# Patient Record
Sex: Female | Born: 1975 | Race: White | Hispanic: No | Marital: Married | Smoking: Former smoker
Health system: Southern US, Community
[De-identification: ages and names within clinical notes are randomized; demographics above are authoritative.]

## PROBLEM LIST (undated history)

## (undated) DIAGNOSIS — D759 Disease of blood and blood-forming organs, unspecified: Secondary | ICD-10-CM

## (undated) DIAGNOSIS — Z9049 Acquired absence of other specified parts of digestive tract: Secondary | ICD-10-CM

## (undated) DIAGNOSIS — Z98891 History of uterine scar from previous surgery: Secondary | ICD-10-CM

## (undated) DIAGNOSIS — I10 Essential (primary) hypertension: Secondary | ICD-10-CM

## (undated) DIAGNOSIS — D649 Anemia, unspecified: Secondary | ICD-10-CM

## (undated) DIAGNOSIS — Z9882 Breast implant status: Secondary | ICD-10-CM

## (undated) DIAGNOSIS — Z87891 Personal history of nicotine dependence: Secondary | ICD-10-CM

## (undated) HISTORY — PX: BREAST SURGERY: SHX581

## (undated) HISTORY — PX: WISDOM TOOTH EXTRACTION: SHX21

## (undated) HISTORY — PX: APPENDECTOMY: SHX54

---

## 1997-06-17 ENCOUNTER — Other Ambulatory Visit: Admission: RE | Admit: 1997-06-17 | Discharge: 1997-06-17 | Payer: Self-pay | Admitting: *Deleted

## 1997-06-26 ENCOUNTER — Other Ambulatory Visit: Admission: RE | Admit: 1997-06-26 | Discharge: 1997-06-26 | Payer: Self-pay | Admitting: Sports Medicine

## 1998-07-22 ENCOUNTER — Other Ambulatory Visit: Admission: RE | Admit: 1998-07-22 | Discharge: 1998-07-22 | Payer: Self-pay | Admitting: *Deleted

## 1999-01-29 ENCOUNTER — Other Ambulatory Visit: Admission: RE | Admit: 1999-01-29 | Discharge: 1999-01-29 | Payer: Self-pay | Admitting: *Deleted

## 1999-11-30 ENCOUNTER — Other Ambulatory Visit: Admission: RE | Admit: 1999-11-30 | Discharge: 1999-11-30 | Payer: Self-pay | Admitting: *Deleted

## 2000-05-30 ENCOUNTER — Other Ambulatory Visit: Admission: RE | Admit: 2000-05-30 | Discharge: 2000-05-30 | Payer: Self-pay | Admitting: *Deleted

## 2000-06-15 ENCOUNTER — Encounter (INDEPENDENT_AMBULATORY_CARE_PROVIDER_SITE_OTHER): Payer: Self-pay | Admitting: Specialist

## 2000-06-15 ENCOUNTER — Other Ambulatory Visit: Admission: RE | Admit: 2000-06-15 | Discharge: 2000-06-15 | Payer: Self-pay | Admitting: *Deleted

## 2000-08-28 ENCOUNTER — Encounter: Admission: RE | Admit: 2000-08-28 | Discharge: 2000-08-28 | Payer: Self-pay | Admitting: *Deleted

## 2001-04-19 ENCOUNTER — Other Ambulatory Visit: Admission: RE | Admit: 2001-04-19 | Discharge: 2001-04-19 | Payer: Self-pay | Admitting: *Deleted

## 2001-08-29 ENCOUNTER — Other Ambulatory Visit: Admission: RE | Admit: 2001-08-29 | Discharge: 2001-08-29 | Payer: Self-pay | Admitting: *Deleted

## 2002-02-22 ENCOUNTER — Other Ambulatory Visit: Admission: RE | Admit: 2002-02-22 | Discharge: 2002-02-22 | Payer: Self-pay | Admitting: *Deleted

## 2002-08-29 ENCOUNTER — Other Ambulatory Visit: Admission: RE | Admit: 2002-08-29 | Discharge: 2002-08-29 | Payer: Self-pay | Admitting: *Deleted

## 2003-03-22 DIAGNOSIS — I4510 Unspecified right bundle-branch block: Secondary | ICD-10-CM

## 2003-03-22 HISTORY — DX: Unspecified right bundle-branch block: I45.10

## 2007-03-04 ENCOUNTER — Encounter
Admission: RE | Admit: 2007-03-04 | Discharge: 2007-03-04 | Payer: Self-pay | Admitting: Physical Medicine and Rehabilitation

## 2012-09-13 LAB — OB RESULTS CONSOLE RUBELLA ANTIBODY, IGM: Rubella: IMMUNE

## 2012-09-13 LAB — OB RESULTS CONSOLE HIV ANTIBODY (ROUTINE TESTING): HIV: NONREACTIVE

## 2012-09-13 LAB — OB RESULTS CONSOLE RPR: RPR: NONREACTIVE

## 2012-09-13 LAB — OB RESULTS CONSOLE ABO/RH: RH Type: POSITIVE

## 2012-09-13 LAB — OB RESULTS CONSOLE ANTIBODY SCREEN: Antibody Screen: NEGATIVE

## 2012-09-13 LAB — OB RESULTS CONSOLE HEPATITIS B SURFACE ANTIGEN: Hepatitis B Surface Ag: NEGATIVE

## 2013-03-28 ENCOUNTER — Other Ambulatory Visit: Payer: Self-pay | Admitting: Obstetrics

## 2013-04-01 ENCOUNTER — Encounter (HOSPITAL_COMMUNITY): Payer: Self-pay | Admitting: Pharmacist

## 2013-04-11 ENCOUNTER — Encounter (HOSPITAL_COMMUNITY): Payer: Self-pay

## 2013-04-12 ENCOUNTER — Encounter (HOSPITAL_COMMUNITY): Payer: Self-pay

## 2013-04-12 ENCOUNTER — Encounter (HOSPITAL_COMMUNITY)
Admission: RE | Admit: 2013-04-12 | Discharge: 2013-04-12 | Disposition: A | Discharge status: Home / Self Care | Payer: BC Managed Care – PPO | Source: Ambulatory Visit | Attending: Obstetrics | Admitting: Obstetrics

## 2013-04-12 DIAGNOSIS — Z01818 Encounter for other preprocedural examination: Secondary | ICD-10-CM

## 2013-04-12 DIAGNOSIS — Z01812 Encounter for preprocedural laboratory examination: Secondary | ICD-10-CM

## 2013-04-12 HISTORY — DX: Disease of blood and blood-forming organs, unspecified: D75.9

## 2013-04-12 LAB — CBC
HCT: 35.1 % — ABNORMAL LOW (ref 36.0–46.0)
Hemoglobin: 12.2 g/dL (ref 12.0–15.0)
MCH: 29.8 pg (ref 26.0–34.0)
MCHC: 34.8 g/dL (ref 30.0–36.0)
MCV: 85.8 fL (ref 78.0–100.0)
Platelets: 293 10*3/uL (ref 150–400)
RBC: 4.09 MIL/uL (ref 3.87–5.11)
RDW: 13.4 % (ref 11.5–15.5)
WBC: 10.4 10*3/uL (ref 4.0–10.5)

## 2013-04-12 LAB — TYPE AND SCREEN
ABO/RH(D): A POS
Antibody Screen: NEGATIVE

## 2013-04-12 LAB — RPR: RPR Ser Ql: NONREACTIVE

## 2013-04-12 LAB — ABO/RH: ABO/RH(D): A POS

## 2013-04-12 NOTE — Patient Instructions (Signed)
[address] Dutchnne A Pintor  04/12/2013   Your procedure is scheduled on:  04/15/13  Enter through the Main Entrance of [facility] at 1130 AM.  Pick up the phone at the desk and dial [phone].   Call this number if you have problems the morning of surgery: [phone]   Remember:   Do not eat food:After Midnight.  Do not drink clear liquids: 4 Hours before arrival.  Take these medicines the morning of surgery with A SIP OF WATER: NA   Do not wear jewelry, make-up or nail polish.  Do not wear lotions, powders, or perfumes. You may wear deodorant.  Do not shave 48 hours prior to surgery.  Do not bring valuables to the hospital.  [facility] is not   responsible for any belongings or valuables brought to the hospital.  Contacts, dentures or bridgework may not be worn into surgery.  Leave suitcase in the car. After surgery it may be brought to your room.  For patients admitted to the hospital, checkout time is 11:00 AM the day of              discharge.   Patients discharged the day of surgery will not be allowed to drive             home.  Name and phone number of your driver: NA  Special Instructions:   Shower using CHG 2 nights before surgery and the night before surgery.  If you shower the day of surgery use CHG.  Use special wash - you have one bottle of CHG for all showers.  You should use approximately 1/3 of the bottle for each shower.   Please read over the following fact sheets that you were given:   Surgical Site Infection Prevention

## 2013-04-15 ENCOUNTER — Inpatient Hospital Stay (HOSPITAL_COMMUNITY): Payer: BC Managed Care – PPO | Admitting: Anesthesiology

## 2013-04-15 ENCOUNTER — Inpatient Hospital Stay (HOSPITAL_COMMUNITY)
Admission: AD | Admit: 2013-04-15 | Discharge: 2013-04-18 | DRG: 765 | Disposition: A | Discharge status: Home / Self Care | Payer: BC Managed Care – PPO | Source: Ambulatory Visit | Attending: Obstetrics | Admitting: Obstetrics

## 2013-04-15 ENCOUNTER — Encounter (HOSPITAL_COMMUNITY): Payer: Self-pay | Admitting: *Deleted

## 2013-04-15 ENCOUNTER — Inpatient Hospital Stay (HOSPITAL_COMMUNITY): Admission: AD | Admit: 2013-04-15 | Payer: BC Managed Care – PPO | Source: Ambulatory Visit | Admitting: Obstetrics

## 2013-04-15 ENCOUNTER — Encounter (HOSPITAL_COMMUNITY)
Admission: AD | Disposition: A | Discharge status: Home / Self Care | Payer: Self-pay | Source: Ambulatory Visit | Attending: Obstetrics

## 2013-04-15 ENCOUNTER — Encounter (HOSPITAL_COMMUNITY): Payer: BC Managed Care – PPO | Admitting: Anesthesiology

## 2013-04-15 DIAGNOSIS — Z22330 Carrier of Group B streptococcus: Secondary | ICD-10-CM

## 2013-04-15 DIAGNOSIS — Z87891 Personal history of nicotine dependence: Secondary | ICD-10-CM

## 2013-04-15 DIAGNOSIS — O99892 Other specified diseases and conditions complicating childbirth: Secondary | ICD-10-CM | POA: Diagnosis present

## 2013-04-15 DIAGNOSIS — O9989 Other specified diseases and conditions complicating pregnancy, childbirth and the puerperium: Secondary | ICD-10-CM

## 2013-04-15 DIAGNOSIS — A6000 Herpesviral infection of urogenital system, unspecified: Secondary | ICD-10-CM | POA: Diagnosis present

## 2013-04-15 DIAGNOSIS — O409XX0 Polyhydramnios, unspecified trimester, not applicable or unspecified: Secondary | ICD-10-CM | POA: Diagnosis present

## 2013-04-15 DIAGNOSIS — D689 Coagulation defect, unspecified: Secondary | ICD-10-CM | POA: Diagnosis present

## 2013-04-15 DIAGNOSIS — O09529 Supervision of elderly multigravida, unspecified trimester: Secondary | ICD-10-CM | POA: Diagnosis present

## 2013-04-15 DIAGNOSIS — Q51810 Arcuate uterus: Secondary | ICD-10-CM

## 2013-04-15 DIAGNOSIS — D6859 Other primary thrombophilia: Secondary | ICD-10-CM | POA: Diagnosis present

## 2013-04-15 DIAGNOSIS — O321XX0 Maternal care for breech presentation, not applicable or unspecified: Principal | ICD-10-CM | POA: Diagnosis present

## 2013-04-15 DIAGNOSIS — O3660X0 Maternal care for excessive fetal growth, unspecified trimester, not applicable or unspecified: Secondary | ICD-10-CM | POA: Diagnosis present

## 2013-04-15 DIAGNOSIS — O9912 Other diseases of the blood and blood-forming organs and certain disorders involving the immune mechanism complicating childbirth: Secondary | ICD-10-CM | POA: Diagnosis present

## 2013-04-15 DIAGNOSIS — O98519 Other viral diseases complicating pregnancy, unspecified trimester: Secondary | ICD-10-CM | POA: Diagnosis present

## 2013-04-15 LAB — CBC
HCT: 36.1 % (ref 36.0–46.0)
Hemoglobin: 12.8 g/dL (ref 12.0–15.0)
MCH: 30.3 pg (ref 26.0–34.0)
MCHC: 35.5 g/dL (ref 30.0–36.0)
MCV: 85.5 fL (ref 78.0–100.0)
Platelets: 256 10*3/uL (ref 150–400)
RBC: 4.22 MIL/uL (ref 3.87–5.11)
RDW: 13.4 % (ref 11.5–15.5)
WBC: 13.1 10*3/uL — ABNORMAL HIGH (ref 4.0–10.5)

## 2013-04-15 LAB — RPR: RPR Ser Ql: NONREACTIVE

## 2013-04-15 SURGERY — Surgical Case
Anesthesia: Spinal

## 2013-04-15 MED ORDER — HYDROMORPHONE HCL PF 1 MG/ML IJ SOLN: 0.2500 mg | INTRAMUSCULAR | Status: DC | PRN

## 2013-04-15 MED ORDER — LACTATED RINGERS IV SOLN
INTRAVENOUS | Status: DC
  Administered 2013-04-15 (×2): via INTRAVENOUS

## 2013-04-15 MED ORDER — DIPHENHYDRAMINE HCL 25 MG PO CAPS: 25.0000 mg | ORAL_CAPSULE | Freq: Four times a day (QID) | ORAL | Status: DC | PRN

## 2013-04-15 MED ORDER — OXYTOCIN 10 UNIT/ML IJ SOLN
40.0000 [IU] | INTRAVENOUS | Status: DC | PRN
  Administered 2013-04-15: 40 [IU] via INTRAVENOUS

## 2013-04-15 MED ORDER — METOCLOPRAMIDE HCL 5 MG/ML IJ SOLN: 10.0000 mg | Freq: Three times a day (TID) | INTRAMUSCULAR | Status: DC | PRN

## 2013-04-15 MED ORDER — LANOLIN HYDROUS EX OINT: 1.0000 "application " | TOPICAL_OINTMENT | CUTANEOUS | Status: DC | PRN

## 2013-04-15 MED ORDER — IBUPROFEN 600 MG PO TABS: 600.0000 mg | ORAL_TABLET | Freq: Four times a day (QID) | ORAL | Status: DC | PRN

## 2013-04-15 MED ORDER — NALBUPHINE HCL 10 MG/ML IJ SOLN
5.0000 mg | INTRAMUSCULAR | Status: DC | PRN
  Filled 2013-04-15: qty 1

## 2013-04-15 MED ORDER — ONDANSETRON HCL 4 MG/2ML IJ SOLN
INTRAMUSCULAR | Status: DC | PRN
  Administered 2013-04-15: 4 mg via INTRAVENOUS

## 2013-04-15 MED ORDER — DIPHENHYDRAMINE HCL 50 MG/ML IJ SOLN: 12.5000 mg | INTRAMUSCULAR | Status: DC | PRN

## 2013-04-15 MED ORDER — NALOXONE HCL 0.4 MG/ML IJ SOLN: 0.4000 mg | INTRAMUSCULAR | Status: DC | PRN

## 2013-04-15 MED ORDER — FENTANYL CITRATE 0.05 MG/ML IJ SOLN
INTRAMUSCULAR | Status: AC
  Filled 2013-04-15: qty 2

## 2013-04-15 MED ORDER — WITCH HAZEL-GLYCERIN EX PADS: 1.0000 "application " | MEDICATED_PAD | CUTANEOUS | Status: DC | PRN

## 2013-04-15 MED ORDER — ONDANSETRON HCL 4 MG PO TABS: 4.0000 mg | ORAL_TABLET | ORAL | Status: DC | PRN

## 2013-04-15 MED ORDER — SODIUM CHLORIDE 0.9 % IJ SOLN: 3.0000 mL | INTRAMUSCULAR | Status: DC | PRN

## 2013-04-15 MED ORDER — DIPHENHYDRAMINE HCL 50 MG/ML IJ SOLN: 25.0000 mg | INTRAMUSCULAR | Status: DC | PRN

## 2013-04-15 MED ORDER — ONDANSETRON HCL 4 MG/2ML IJ SOLN: 4.0000 mg | Freq: Three times a day (TID) | INTRAMUSCULAR | Status: DC | PRN

## 2013-04-15 MED ORDER — FAMOTIDINE IN NACL 20-0.9 MG/50ML-% IV SOLN
20.0000 mg | Freq: Once | INTRAVENOUS | Status: AC
  Administered 2013-04-15: 20 mg via INTRAVENOUS
  Filled 2013-04-15: qty 50

## 2013-04-15 MED ORDER — KETOROLAC TROMETHAMINE 30 MG/ML IJ SOLN
INTRAMUSCULAR | Status: AC
  Administered 2013-04-15: 30 mg via INTRAVENOUS
  Filled 2013-04-15: qty 1

## 2013-04-15 MED ORDER — KETOROLAC TROMETHAMINE 30 MG/ML IJ SOLN
30.0000 mg | Freq: Four times a day (QID) | INTRAMUSCULAR | Status: DC | PRN
  Administered 2013-04-15: 30 mg via INTRAVENOUS

## 2013-04-15 MED ORDER — TETANUS-DIPHTH-ACELL PERTUSSIS 5-2.5-18.5 LF-MCG/0.5 IM SUSP: 0.5000 mL | Freq: Once | INTRAMUSCULAR | Status: DC

## 2013-04-15 MED ORDER — ZOLPIDEM TARTRATE 5 MG PO TABS: 5.0000 mg | ORAL_TABLET | Freq: Every evening | ORAL | Status: DC | PRN

## 2013-04-15 MED ORDER — FENTANYL CITRATE 0.05 MG/ML IJ SOLN
INTRAMUSCULAR | Status: DC | PRN
  Administered 2013-04-15: 10 ug via INTRATHECAL

## 2013-04-15 MED ORDER — PROMETHAZINE HCL 25 MG/ML IJ SOLN: 6.2500 mg | INTRAMUSCULAR | Status: DC | PRN

## 2013-04-15 MED ORDER — IBUPROFEN 600 MG PO TABS
600.0000 mg | ORAL_TABLET | Freq: Four times a day (QID) | ORAL | Status: DC
  Administered 2013-04-15 – 2013-04-18 (×13): 600 mg via ORAL
  Filled 2013-04-15 (×13): qty 1

## 2013-04-15 MED ORDER — LACTATED RINGERS IV SOLN
INTRAVENOUS | Status: DC
  Administered 2013-04-15: 16:00:00 via INTRAVENOUS

## 2013-04-15 MED ORDER — MEPERIDINE HCL 25 MG/ML IJ SOLN
INTRAMUSCULAR | Status: DC | PRN
  Administered 2013-04-15: 12.5 mg via INTRAVENOUS

## 2013-04-15 MED ORDER — MEPERIDINE HCL 25 MG/ML IJ SOLN
INTRAMUSCULAR | Status: AC
  Filled 2013-04-15: qty 1

## 2013-04-15 MED ORDER — KETOROLAC TROMETHAMINE 30 MG/ML IJ SOLN: 15.0000 mg | Freq: Once | INTRAMUSCULAR | Status: DC | PRN

## 2013-04-15 MED ORDER — PHENYLEPHRINE HCL 10 MG/ML IJ SOLN
INTRAMUSCULAR | Status: DC | PRN
  Administered 2013-04-15: 40 ug via INTRAVENOUS
  Administered 2013-04-15 (×6): 80 ug via INTRAVENOUS

## 2013-04-15 MED ORDER — SIMETHICONE 80 MG PO CHEW
80.0000 mg | CHEWABLE_TABLET | Freq: Three times a day (TID) | ORAL | Status: DC
  Administered 2013-04-15 – 2013-04-18 (×8): 80 mg via ORAL
  Filled 2013-04-15 (×7): qty 1

## 2013-04-15 MED ORDER — SIMETHICONE 80 MG PO CHEW: 80.0000 mg | CHEWABLE_TABLET | ORAL | Status: DC | PRN

## 2013-04-15 MED ORDER — CEFAZOLIN SODIUM-DEXTROSE 2-3 GM-% IV SOLR
2.0000 g | INTRAVENOUS | Status: AC
  Administered 2013-04-15: 2 g via INTRAVENOUS

## 2013-04-15 MED ORDER — MORPHINE SULFATE 0.5 MG/ML IJ SOLN
INTRAMUSCULAR | Status: AC
  Filled 2013-04-15: qty 10

## 2013-04-15 MED ORDER — SIMETHICONE 80 MG PO CHEW
80.0000 mg | CHEWABLE_TABLET | ORAL | Status: DC
  Administered 2013-04-15 – 2013-04-18 (×3): 80 mg via ORAL
  Filled 2013-04-15 (×5): qty 1

## 2013-04-15 MED ORDER — DIBUCAINE 1 % RE OINT: 1.0000 "application " | TOPICAL_OINTMENT | RECTAL | Status: DC | PRN

## 2013-04-15 MED ORDER — OXYCODONE-ACETAMINOPHEN 5-325 MG PO TABS
1.0000 | ORAL_TABLET | ORAL | Status: DC | PRN
  Administered 2013-04-15: 2 via ORAL
  Administered 2013-04-16 – 2013-04-18 (×5): 1 via ORAL
  Filled 2013-04-15 (×3): qty 1
  Filled 2013-04-15: qty 2
  Filled 2013-04-15 (×2): qty 1

## 2013-04-15 MED ORDER — MORPHINE SULFATE (PF) 0.5 MG/ML IJ SOLN
INTRAMUSCULAR | Status: DC | PRN
  Administered 2013-04-15: .2 mg via INTRATHECAL

## 2013-04-15 MED ORDER — MEPERIDINE HCL 25 MG/ML IJ SOLN: 6.2500 mg | INTRAMUSCULAR | Status: DC | PRN

## 2013-04-15 MED ORDER — MENTHOL 3 MG MT LOZG: 1.0000 | LOZENGE | OROMUCOSAL | Status: DC | PRN

## 2013-04-15 MED ORDER — SCOPOLAMINE 1 MG/3DAYS TD PT72
1.0000 | MEDICATED_PATCH | Freq: Once | TRANSDERMAL | Status: AC
  Administered 2013-04-15: 1 via TRANSDERMAL
  Filled 2013-04-15: qty 1

## 2013-04-15 MED ORDER — LACTATED RINGERS IV SOLN
INTRAVENOUS | Status: DC
  Administered 2013-04-15: 03:00:00 via INTRAVENOUS

## 2013-04-15 MED ORDER — KETOROLAC TROMETHAMINE 30 MG/ML IJ SOLN: 30.0000 mg | Freq: Four times a day (QID) | INTRAMUSCULAR | Status: DC | PRN

## 2013-04-15 MED ORDER — OXYTOCIN 40 UNITS IN LACTATED RINGERS INFUSION - SIMPLE MED: 62.5000 mL/h | INTRAVENOUS | Status: AC

## 2013-04-15 MED ORDER — LACTATED RINGERS IV BOLUS (SEPSIS): 1000.0000 mL | Freq: Once | INTRAVENOUS | Status: DC

## 2013-04-15 MED ORDER — BUPIVACAINE IN DEXTROSE 0.75-8.25 % IT SOLN
INTRATHECAL | Status: DC | PRN
  Administered 2013-04-15: 1.4 mL via INTRATHECAL

## 2013-04-15 MED ORDER — LACTATED RINGERS IV SOLN: Freq: Once | INTRAVENOUS | Status: DC

## 2013-04-15 MED ORDER — PRENATAL MULTIVITAMIN CH
1.0000 | ORAL_TABLET | Freq: Every day | ORAL | Status: DC
  Administered 2013-04-15 – 2013-04-18 (×4): 1 via ORAL
  Filled 2013-04-15 (×4): qty 1

## 2013-04-15 MED ORDER — DIPHENHYDRAMINE HCL 25 MG PO CAPS: 25.0000 mg | ORAL_CAPSULE | ORAL | Status: DC | PRN

## 2013-04-15 MED ORDER — NALOXONE HCL 1 MG/ML IJ SOLN: 1.0000 ug/kg/h | INTRAVENOUS | Status: DC | PRN

## 2013-04-15 MED ORDER — SENNOSIDES-DOCUSATE SODIUM 8.6-50 MG PO TABS
2.0000 | ORAL_TABLET | ORAL | Status: DC
  Administered 2013-04-15 – 2013-04-18 (×3): 2 via ORAL
  Filled 2013-04-15 (×4): qty 2

## 2013-04-15 MED ORDER — SCOPOLAMINE 1 MG/3DAYS TD PT72: 1.0000 | MEDICATED_PATCH | Freq: Once | TRANSDERMAL | Status: DC

## 2013-04-15 MED ORDER — ONDANSETRON HCL 4 MG/2ML IJ SOLN: 4.0000 mg | INTRAMUSCULAR | Status: DC | PRN

## 2013-04-15 MED ORDER — CITRIC ACID-SODIUM CITRATE 334-500 MG/5ML PO SOLN
30.0000 mL | Freq: Once | ORAL | Status: AC
  Administered 2013-04-15: 30 mL via ORAL
  Filled 2013-04-15: qty 15

## 2013-04-15 SURGICAL SUPPLY — 32 items
CLAMP CORD UMBIL (MISCELLANEOUS) IMPLANT
CLOTH BEACON ORANGE TIMEOUT ST (SAFETY) ×2 IMPLANT
CONTAINER PREFILL 10% NBF 15ML (MISCELLANEOUS) IMPLANT
DRAPE LG THREE QUARTER DISP (DRAPES) IMPLANT
DRSG OPSITE POSTOP 4X10 (GAUZE/BANDAGES/DRESSINGS) ×2 IMPLANT
DURAPREP 26ML APPLICATOR (WOUND CARE) ×2 IMPLANT
ELECT REM PT RETURN 9FT ADLT (ELECTROSURGICAL) ×2
ELECTRODE REM PT RTRN 9FT ADLT (ELECTROSURGICAL) ×1 IMPLANT
EXTRACTOR VACUUM KIWI (MISCELLANEOUS) IMPLANT
EXTRACTOR VACUUM M CUP 4 TUBE (SUCTIONS) IMPLANT
GLOVE BIO SURGEON STRL SZ 6.5 (GLOVE) ×2 IMPLANT
GLOVE BIOGEL PI IND STRL 7.0 (GLOVE) ×1 IMPLANT
GLOVE BIOGEL PI INDICATOR 7.0 (GLOVE) ×1
GOWN STRL REUS W/TWL LRG LVL3 (GOWN DISPOSABLE) ×4 IMPLANT
KIT ABG SYR 3ML LUER SLIP (SYRINGE) IMPLANT
NEEDLE HYPO 25X5/8 SAFETYGLIDE (NEEDLE) IMPLANT
NS IRRIG 1000ML POUR BTL (IV SOLUTION) ×2 IMPLANT
PACK C SECTION WH (CUSTOM PROCEDURE TRAY) ×2 IMPLANT
PAD OB MATERNITY 4.3X12.25 (PERSONAL CARE ITEMS) ×2 IMPLANT
STAPLER VISISTAT 35W (STAPLE) IMPLANT
STRIP CLOSURE SKIN 1/2X4 (GAUZE/BANDAGES/DRESSINGS) IMPLANT
SUT MON AB 4-0 PS1 27 (SUTURE) ×2 IMPLANT
SUT PLAIN 0 NONE (SUTURE) IMPLANT
SUT PLAIN 2 0 XLH (SUTURE) ×2 IMPLANT
SUT VIC AB 0 CT1 36 (SUTURE) ×2 IMPLANT
SUT VIC AB 0 CTX 36 (SUTURE) ×3
SUT VIC AB 0 CTX36XBRD ANBCTRL (SUTURE) ×3 IMPLANT
SUT VIC AB 2-0 CT1 27 (SUTURE) ×1
SUT VIC AB 2-0 CT1 TAPERPNT 27 (SUTURE) ×1 IMPLANT
TOWEL OR 17X24 6PK STRL BLUE (TOWEL DISPOSABLE) ×2 IMPLANT
TRAY FOLEY CATH 14FR (SET/KITS/TRAYS/PACK) ×2 IMPLANT
WATER STERILE IRR 1000ML POUR (IV SOLUTION) ×2 IMPLANT

## 2013-04-15 NOTE — Transfer of Care (Signed)
Immediate Anesthesia Transfer of Care Note  Patient: Caroline Friedman  Procedure(s) Performed: Procedure(s) with comments: Primary CESAREAN SECTION (N/A) - EDD: 04/20/13  Patient Location: PACU  Anesthesia Type:Spinal  Level of Consciousness: awake, alert  and oriented  Airway & Oxygen Therapy: Patient Spontanous Breathing  Post-op Assessment: Report given to PACU RN and Post -op Vital signs reviewed and stable  Post vital signs: Reviewed and stable  Complications: No apparent anesthesia complications

## 2013-04-15 NOTE — Anesthesia Postprocedure Evaluation (Signed)
Anesthesia Post Note  Patient: Caroline Friedman  Procedure(s) Performed: Procedure(s) (LRB): Primary CESAREAN SECTION (N/A)  Anesthesia type: Spinal  Patient location: PACU  Post pain: Pain level controlled  Post assessment: Post-op Vital signs reviewed  Last Vitals:  Filed Vitals:   04/15/13 0500  BP: 116/70  Pulse: 74  Temp:   Resp: 16    Post vital signs: Reviewed  Level of consciousness: awake  Complications: No apparent anesthesia complications

## 2013-04-15 NOTE — MAU Note (Signed)
Pt reports contractions, scheduled c/s for breech presentation.

## 2013-04-15 NOTE — Progress Notes (Signed)
I received a referral from pt's RN based on a history of loss and pt having her baby in the NICU.  Thurston Holenne and her family seemed to be coping well.  They are anxious to have their baby back with them, but grateful that he has a good team taking care of him.  They did not wish to talk further at this time, but were grateful for the visit.  Centex CorporationChaplain Katy Vala Raffo Pager, [phone] 3:58 PM

## 2013-04-15 NOTE — Anesthesia Postprocedure Evaluation (Signed)
Anesthesia Post Note  Patient: Caroline Friedman  Procedure(s) Performed: Procedure(s) (LRB): Primary CESAREAN SECTION (N/A)  Anesthesia type: SAB  Patient location: Mother/Baby  Post pain: Pain level controlled  Post assessment: Post-op Vital signs reviewed  Last Vitals:  Filed Vitals:   04/15/13 0946  BP: 114/70  Pulse: 71  Temp:   Resp: 18    Post vital signs: Reviewed  Level of consciousness: awake  Complications: No apparent anesthesia complications

## 2013-04-15 NOTE — Progress Notes (Signed)
Dr Ernestina PennaFogleman updated on SVE, UC pattern, pt comfort level, and FHR.  Pt scheduled for c-section today at 1300 due to breech presentation.  Dr Ernestina PennaFogleman gave orders to prepare pt for C-section.

## 2013-04-15 NOTE — Op Note (Signed)
04/15/2013  5:11 AM  PATIENT:  Caroline Friedman  38 y.o. female  PRE-OPERATIVE DIAGNOSIS:  Breech, Thrombophilia, Heparin Window, presumed macrosomia  POST-OPERATIVE DIAGNOSIS:  Vertex, Thrombophilia, Heparin Window  PROCEDURE:  Procedure(s) with comments: Primary CESAREAN SECTION (N/A) - EDD: 04/20/13  SURGEON:  Surgeon(s) and Role:    Tresa Endo A. Ernestina Penna, MD - Primary  PHYSICIAN ASSISTANT:   ASSISTANTS: none   ANESTHESIA:   spinal  EBL:  Total I/O In: 1800 [I.V.:1800] Out: 1100 [Urine:100; Blood:1000]  BLOOD ADMINISTERED:none  DRAINS: Urinary Catheter (Foley)   LOCAL MEDICATIONS USED:  NONE  SPECIMEN:  Source of Specimen:  placents  DISPOSITION OF SPECIMEN:  L&D  COUNTS:  YES  TOURNIQUET:  * No tourniquets in log *  DICTATION: .Note written in EPIC  PLAN OF CARE: Admit to inpatient   PATIENT DISPOSITION:  PACU - hemodynamically stable.   Delay start of Pharmacological VTE agent (>24hrs) due to surgical blood loss or risk of bleeding: yes   Findings:  @BABYSEXEBC @ infant,  APGAR (1 MIN):   APGAR (5 MINS):   APGAR (10 MINS):    Arcuate shaped uterus, nl tubes and ovaries, no uterine septum, clear amniotic fluid, vtx presentation  EBL: 1000 cc Antibiotics:  2g Ancef  Complications: none  Indications: This is a 38 y.o. year-old, G8P0  At [redacted]w[redacted]d admitted for labor with presumed macrosomia and presumed breech presentation. Pt was planning PCS later today but presented earlier due to contractions. Pt has expressed desire for c/s even if baby no longer breech and presentation was thus not confirmed on day of surgery. Risks benefits and alternatives of the procedure were discussed with the patient who agreed to proceed  Procedure:  After informed consent was obtained the patient was taken to the operating room where spinal anesthesia was initiated.  She was prepped and draped in the normal sterile fashion in dorsal supine position with a leftward tilt.  A foley  catheter was in place.  A Pfannenstiel skin incision was made 2 cm above the pubic symphysis in the midline with the scalpel.  Dissection was carried down with the Bovie cautery until the fascia was reached. The fascia was incised in the midline. The incision was extended laterally with the Mayo scissors. The inferior aspect of the fascial incision was grasped with the Coker clamps, elevated up and the underlying rectus muscles were dissected off sharply. The superior aspect of the fascial incision was grasped with the Coker clamps elevated up and the underlying rectus muscles were dissected off sharply.  The peritoneum was entered sharply. The peritoneal incision was extended superiorly and inferiorly with good visualization of the bladder. The bladder blade was inserted and palpation was done to assess the fetal position and the location of the uterine vessels. The lower segment of the uterus was incised sharply with the scalpel and extended bluntly in a cephalo-caudad fashion. The infant was found to be vtx,  grasped, brought to the incision,  rotated and the infant was delivered with fundal pressure. The nose and mouth were bulb suctioned. The cord was clamped and cut. The infant was handed off to the waiting pediatrician. The placenta was expressed. The uterus was exteriorized. The uterus was cleared of all clots and debris. Arcuate variation was noted. The uterine incision was repaired with 0 Vicryl in a running locked fashion.  A second layer of the same suture was used in an imbricating fashion to obtain excellent hemostasis. Several additional figure of 8 sutures were placed  to control hemostasis. The uterus was then returned to the abdomen, the gutters were cleared of all clots and debris. The uterine incision was reinspected and found to be hemostatic. The peritoneum was grasped and closed with 2-0 Vicryl in a running fashion. The cut muscle edges and the underside of the fascia were inspected and found  to be hemostatic. The fascia was closed with 0 Vicryl in a single layer. The subcutaneous tissue was irrigated. Scarpa's layer was closed with a 2-0 plain gut suture. The skin was closed with a 4-0 Monocryl in a single layer. The patient tolerated the procedure well. Sponge lap and needle counts were correct x3 and patient was taken to the recovery room in a stable condition.  Aiden Helzer A. 04/15/2013 5:13 AM

## 2013-04-15 NOTE — Progress Notes (Signed)
Patient ID: Elam DutchAnne A Esco, female   DOB: 06/13/1975, 38 y.o.   MRN: [id number] INTERVAL NOTE:  S:   Lying in bed, min cramping, (+) voids, small bleed, denies HA/NV/dizziness  O:   VSS, AAO x 3, NAD  Fundus @ U  Scant lochia  A / P:   PPD #0  Stable post partum  Transport to NICU for feedings  Routine PP orders  Kenard GowerAWSON, Jerimey Burridge, M, MSN, CNM 04/15/2013, 9:44 AM

## 2013-04-15 NOTE — Anesthesia Preprocedure Evaluation (Addendum)
Anesthesia Evaluation  Patient identified by MRN, date of birth, ID band Patient awake    Reviewed: Allergy & Precautions, H&P , NPO status , Patient's Chart, lab work & pertinent test results  Airway Mallampati: I TM Distance: >3 FB Neck ROM: full    Dental no notable dental hx.    Pulmonary former smoker,    Pulmonary exam normal       Cardiovascular negative cardio ROS      Neuro/Psych negative neurological ROS  negative psych ROS   GI/Hepatic negative GI ROS, Neg liver ROS,   Endo/Other  negative endocrine ROS  Renal/GU negative Renal ROS     Musculoskeletal   Abdominal Normal abdominal exam  (+)   Peds  Hematology negative hematology ROS (+)   Anesthesia Other Findings   Reproductive/Obstetrics (+) Pregnancy                          Anesthesia Physical Anesthesia Plan  ASA: II  Anesthesia Plan: Spinal   Post-op Pain Management:    Induction:   Airway Management Planned:   Additional Equipment:   Intra-op Plan:   Post-operative Plan:   Informed Consent: I have reviewed the patients History and Physical, chart, labs and discussed the procedure including the risks, benefits and alternatives for the proposed anesthesia with the patient or authorized representative who has indicated his/her understanding and acceptance.     Plan Discussed with:   Anesthesia Plan Comments:        Anesthesia Quick Evaluation

## 2013-04-15 NOTE — Progress Notes (Signed)
Ur chart review completed.  

## 2013-04-15 NOTE — MAU Note (Signed)
Pt reports UC starting at 1300 on 1/25 gradually getting worse.  Now ~3 min apart.  No leaking of fluid.

## 2013-04-15 NOTE — H&P (Signed)
Caroline Friedman is a 37 y.o. G8P0070 at 39 wks for PCS for breech and macrosomia. Pt undergoing surgery through a heparin window. Pt notes onset contractions this am, got stronger and closer through the night . Good fetal movement, scant vaginal bleeding, not leaking fluid. Last ate 7p, last heparin 10,000 units, about 19 hrs ago.   [facility] 6 wks  - dated by first trimester u/s  - RPL. MAB x 3, also TOP x 4 (unknown to partner). W/u revealed arcuate uterus, MTHFR heterozygous and nl homocyteine level, on folic acid replacment; and PAI overactivity- homozygous, for which REI consultation recommended heparin w/ pos SPT, conversion to Lovenox in the 2nd trimester and back to heparin close to term.  - Breech presentation. Declines version. 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 AFI 22 at 36 wks, growth 6'11, 84%   Prenatal Transfer Tool   Maternal Diabetes: No  Genetic Screening: Normal  Maternal Ultrasounds/Referrals: Normal  Fetal Ultrasounds or other Referrals: None  Maternal Substance Abuse: No  Significant Maternal Medications: Meds include: Other:  Significant Maternal Lab Results: None  PMH: strong FH breast CA. Pt's mother BRCA neg  PSH: breast augmentation, vulvar WLE for VIN  SH: former smoker, teacher   Review of Systems - Negative except contractions  Filed Vitals:   04/15/13 0209  BP: 114/74  Pulse: 95  Temp: 97.8 F (36.6 C)  TempSrc: Oral  Resp: 20  Height: 5' 3" (1.6 m)  Weight: 79.379 kg (175 lb)  SpO2: 99%    Physical Exam: breathing through contractions  Gen: well appearing when not contracting  Back: no CVAT  Abd: gravid, NT, no RUQ pain  LE: 1+ edema, equal bilaterally, non-tender  Toco: q 5 min, not tracing well due to pt movement  FH: baseline 120s, accelerations present, no deceleratons,  10 beat variability   Prenatal labs:  ABO, Rh: A pos Antibody: neg  Rubella: immune  RPR: NR  HBsAg: neg  HIV: neg  GBS: positive  1 hr Glucola 102  Genetic screening nl NT, nl AFO  Anatomy US nl u/s   Assessment/Plan: 37 y.o. G8P0 with term breech IUP, now in labor  PCS, pt aware R/B and agrees to proceed  - thrombophilia. Last heparin the am prior to surgery, switch to baby ASA post-op GBS pos. Ancef for c/s    FOGLEMAN,KELLY A. 04/15/2013 3:02 AM    

## 2013-04-15 NOTE — Anesthesia Procedure Notes (Addendum)
Spinal  Patient location during procedure: OR Start time: 04/15/2013 3:25 AM End time: 04/15/2013 3:27 AM Staffing Anesthesiologist: Leilani AbleHATCHETT, Jerlyn Pain Performed by: anesthesiologist  Preanesthetic Checklist Completed: patient identified, surgical consent, pre-op evaluation, timeout performed, IV checked, risks and benefits discussed and monitors and equipment checked Spinal Block Patient position: sitting Prep: DuraPrep Patient monitoring: heart rate, cardiac monitor, continuous pulse ox and blood pressure Approach: midline Location: L3-4 Injection technique: single-shot Needle Needle type: Sprotte  Needle gauge: 24 G Needle length: 9 cm Needle insertion depth: 4 cm Assessment Sensory level: T4

## 2013-04-15 NOTE — Brief Op Note (Signed)
04/15/2013  5:11 AM  PATIENT:  Elam DutchAnne A Prevost  38 y.o. female  PRE-OPERATIVE DIAGNOSIS:  Breech, Thrombophilia, Heparin Window, presumed macrosomia  POST-OPERATIVE DIAGNOSIS:  Vertex, Thrombophilia, Heparin Window  PROCEDURE:  Procedure(s) with comments: Primary CESAREAN SECTION (N/A) - EDD: 04/20/13  SURGEON:  Surgeon(s) and Role:    Tresa Endo* Daijon Wenke A. Ernestina PennaFogleman, MD - Primary  PHYSICIAN ASSISTANT:   ASSISTANTS: none   ANESTHESIA:   spinal  EBL:  Total I/O In: 1800 [I.V.:1800] Out: 1100 [Urine:100; Blood:1000]  BLOOD ADMINISTERED:none  DRAINS: Urinary Catheter (Foley)   LOCAL MEDICATIONS USED:  NONE  SPECIMEN:  Source of Specimen:  placents  DISPOSITION OF SPECIMEN:  L&D  COUNTS:  YES  TOURNIQUET:  * No tourniquets in log *  DICTATION: .Note written in EPIC  PLAN OF CARE: Admit to inpatient   PATIENT DISPOSITION:  PACU - hemodynamically stable.   Delay start of Pharmacological VTE agent (>24hrs) due to surgical blood loss or risk of bleeding: yes

## 2013-04-15 NOTE — Consult Note (Signed)
Neonatology Note:   Attendance at C-section:    I was asked by Dr. Ernestina PennaFogleman to attend this primary C/S at term due to breech presentation. She was scheduled for a C/S later today, but arrived in labor. The mother is a G8P0A7 A pos, GBS pos with thrombophilia during the pregnancy, on Lovenox. There is a remote history of HSV and Valtrex had been prescribed, but mother states she was not taking it. ROM at delivery, fluid clear. Baby delivered vertex. Infant vigorous with good spontaneous cry and tone. Needed only minimal bulb suctioning. Ap 9/9. Lungs clear to ausc in DR. Thee is a very superficial laceration on the left forehead, shown to father. There is also a 2-3 mm round denuded area on skin of left flank which appears to be a friction blister, in the center of which is a pinpoint white-yellow pustule. There are no other skin lesions and this single lesion does not have a "clustered" appearance suggestive of HSV. To CN to care of Pediatrician. Would recommend close observation for any further skin lesions or any change in the existing one. Feel the risk for HSV infection is very low, but not zero. Will discuss with parents in recovery.   Doretha Souhristie C. Garreth Burnsworth, MD

## 2013-04-16 ENCOUNTER — Encounter (HOSPITAL_COMMUNITY): Payer: Self-pay | Admitting: Obstetrics

## 2013-04-16 LAB — CBC
HCT: 32.9 % — ABNORMAL LOW (ref 36.0–46.0)
Hemoglobin: 11.3 g/dL — ABNORMAL LOW (ref 12.0–15.0)
MCH: 30.3 pg (ref 26.0–34.0)
MCHC: 34.3 g/dL (ref 30.0–36.0)
MCV: 88.2 fL (ref 78.0–100.0)
Platelets: 247 10*3/uL (ref 150–400)
RBC: 3.73 MIL/uL — ABNORMAL LOW (ref 3.87–5.11)
RDW: 14 % (ref 11.5–15.5)
WBC: 17.2 10*3/uL — ABNORMAL HIGH (ref 4.0–10.5)

## 2013-04-16 NOTE — Progress Notes (Signed)
Patient ID: Caroline Friedman, female   DOB: 11/22/1975, 38 y.o.   MRN: [id number] POD # 1  Subjective: Pt reports feeling sore/ Pain controlled with ibuprofen and percocet Tolerating po/ Foley d/c'ed and voiding without problems/ No n/v/Flatus neg Activity: out of bed and ambulate Bleeding is light Newborn info: Female, in NICU; monitoring lesion on flank for possible HSV; stable and initial culture is negative;  Feeding: breast/pumping   Objective: VS: Blood pressure 96/59, pulse 88, temperature 98 F (36.7 C), temperature source Oral, resp. rate 18.    Intake/Output Summary (Last 24 hours) at 04/16/13 0854 Last data filed at 04/15/13 2000  Gross per 24 hour  Intake 827.96 ml  Output   1850 ml  Net -1022.04 ml      Recent Labs  04/15/13 0300 04/16/13 0550  WBC 13.1* 17.2*  HGB 12.8 11.3*  HCT 36.1 32.9*  PLT 256 247    Blood type: --/--/A POS, A POS (01/23 1400) Rubella: Immune (06/26 0949)    Physical Exam:  General: alert, cooperative and no distress CV: Regular rate and rhythm Resp: clear Abdomen: soft, nontender, normal bowel sounds Incision: Covered with Tegaderm and honeycomb dressing; well approximated. Uterine Fundus: firm, below umbilicus, nontender Lochia: minimal Ext: edema trace and Homans sign is negative, no sign of DVT    A/P: POD # 1/ G8P1070 S/P Primary C/Section d/t Breech w/ hx thrombophilia/MTHFR Doing well Continue routine post op orders   Signed: Demetrius RevelFISHER,Larcenia Holaday K, MSN, [facility]WHNP 04/16/2013, 8:54 AM

## 2013-04-16 NOTE — Lactation Note (Signed)
This note was copied from the chart of Caroline Sharolyn Douglasnne Dunnaway. Lactation Consultation Note   Follow up consult with this mom and baby, in NICU. Mom has flat nipples, i assisted her with latching her baby, with 24 nipple shield. i placed about 1 ml of expressed colostrum into baby's mouth to elicit a suck. He fed with shield for about 10 minutes. Colostrum was seen in shield after being removed. Baby fed formula by bottle pc. Mom  Aware she may not be able to provide breast milk, due to her past breast surgery. Mom will be discharged on Thursday, 2 days from now. She may rent a DEP, or if she is nsot getting any milk, may go home with hand pump. Mom is calling her insurance company for DEP.  Patient Name: Caroline Friedman ZOXWR'UToday's Date: 04/16/2013 Reason for consult: Follow-up assessment;NICU baby   Maternal Data    Feeding Feeding Type: Breast Fed Nipple Type: Slow - flow Length of feed: 10 min  LATCH Score/Interventions Latch: Repeated attempts needed to sustain latch, nipple held in mouth throughout feeding, stimulation needed to elicit sucking reflex. (latched well with 24 nipple shield) Intervention(s): Adjust position;Assist with latch  Audible Swallowing: None  Type of Nipple: Flat  Comfort (Breast/Nipple): Filling, red/small blisters or bruises, mild/mod discomfort (dried scab on left nipple from intial latch, after birth)  Problem noted: Mild/Moderate discomfort Interventions (Mild/moderate discomfort): Hand expression (EBM to nipple)  Hold (Positioning): Assistance needed to correctly position infant at breast and maintain latch. Intervention(s): Breastfeeding basics reviewed;Support Pillows;Position options  LATCH Score: 4  Lactation Tools Discussed/Used Tools: Pump Nipple shield size: 24 Breast pump type: Double-Electric Breast Pump WIC Program: No Pump Review: Setup, frequency, and cleaning;Milk Storage;Other (comment) (hand expression taught to mom, with return demonstration  done)   Consult Status Consult Status: Follow-up Date: 04/17/13 Follow-up type: In-patient    Caroline Friedman, Caroline Friedman 04/16/2013, 4:19 PM

## 2013-04-16 NOTE — Lactation Note (Signed)
This note was copied from the chart of Caroline Sharolyn Douglasnne Harwick. Lactation Consultation Note    Follow up consult with this mom of a term baby in NICU, now 31 hours post partum. Mom reports she has been pumping, but not expressing any colostrum. I showed mom how to hand express, and she was able to express about 1 mls of very thick colostrum - steady flow from left brest. I advised mom to have baby's nurse call when he feeds, so I could hlpe her with latching. i will follow this family in the NICU.  Patient Name: Caroline Friedman ZOXWR'UToday's Date: 04/16/2013 Reason for consult: Follow-up assessment;NICU baby   Maternal Data    Feeding Feeding Type: Bottle Fed - Formula Nipple Type: Slow - flow Length of feed: 20 min  LATCH Score/Interventions                      Lactation Tools Discussed/Used Tools: Pump Breast pump type: Double-Electric Breast Pump Pump Review: Setup, frequency, and cleaning;Milk Storage;Other (comment) (hand expression taught to mom, with return demonstration done)   Consult Status Consult Status: Follow-up Date: 04/17/13 Follow-up type: In-patient    Alfred LevinsLee, Amaani Guilbault Salvador 04/16/2013, 3:34 PM

## 2013-04-17 ENCOUNTER — Encounter (HOSPITAL_COMMUNITY): Payer: Self-pay | Admitting: *Deleted

## 2013-04-17 MED ORDER — ASPIRIN 81 MG PO CHEW
81.0000 mg | CHEWABLE_TABLET | Freq: Every day | ORAL | Status: DC
  Administered 2013-04-18: 81 mg via ORAL
  Filled 2013-04-17 (×2): qty 1

## 2013-04-17 NOTE — Addendum Note (Signed)
Addendum created 04/17/13 2209 by Leilani AbleFranklin Flavia Bruss, MD   Modules edited: Anesthesia Responsible Staff

## 2013-04-17 NOTE — Lactation Note (Signed)
This note was copied from the chart of Caroline Friedman Melito. Lactation Consultation Note   Follow up consult with this mom fo a term baby. She has had breast surgery in the past. Today , mom was able to pump and hand express, a total of 15 mls of transitional milk. Mom first breast fed him with a 24 nipple shield, for about 15 minutes, and there was  Milk in the shield. He then fed the 15 mls of EBM by bottle, and then some formula. Mom knows to call for questions/concerns.  Patient Name: Caroline Friedman Solomon WUJWJ'XToday's Date: 04/17/2013     Maternal Data    Feeding    LATCH Score/Interventions                      Lactation Tools Discussed/Used     Consult Status      Alfred LevinsLee, Marguerette Sheller Krystalyn 04/17/2013, 4:55 PM

## 2013-04-17 NOTE — Progress Notes (Signed)
Patient ID: Caroline Friedman, female   DOB: 03-May-1975, 38 y.o.   MRN: [id number] POD # 2  Subjective: Pt reports feeling well/ Pain controlled with ibuprofen and percocet Tolerating po/Voiding without problems/ No n/v/Flatus pos Activity: out of bed and ambulate Bleeding is light Newborn info:  Information for the patient's newborn:  Jessica PriestSteele, Boy Margi [[id number]][[id number]]  female  / circ pending per Dr Prudencio PairFolgleman; Infant stable and now in room with Mother.  Lesion is probable minor laceration occuring in utero Feeding: breast   Objective: VS: BP 103/66  Pulse 74  Temp(Src) 98.1 F (36.7 C) (Oral)  Resp 18   Physical Exam:  General: alert, cooperative and no distress CV: Regular rate and rhythm Resp: clear Abdomen: soft, nontender, normal bowel sounds Uterine Fundus: firm, below umbilicus, nontender Incision: Covered with Tegaderm and honeycomb dressing; well approximated. Lochia: minimal Ext: Homans sign is negative, no sign of DVT and no edema, redness or tenderness in the calves or thighs    A/P: POD # 2/ G8P1071/ S/P Primary C/Section d/t Breech/hx MTHFR w/thrombophilia Doing well Continue routine post op orders Anticipate discharge home in the am   Signed: Demetrius RevelFISHER,Jeronimo Hellberg K, MSN, [facility]WHNP 04/17/2013, 11:16 AM

## 2013-04-18 ENCOUNTER — Encounter (HOSPITAL_COMMUNITY)
Admission: RE | Admit: 2013-04-18 | Discharge: 2013-04-18 | Disposition: A | Discharge status: Home / Self Care | Payer: BC Managed Care – PPO | Source: Ambulatory Visit | Attending: Obstetrics | Admitting: Obstetrics

## 2013-04-18 DIAGNOSIS — O923 Agalactia: Secondary | ICD-10-CM | POA: Insufficient documentation

## 2013-04-18 MED ORDER — DOCUSATE SODIUM 100 MG PO CAPS: 100.0000 mg | ORAL_CAPSULE | Freq: Two times a day (BID) | ORAL | Status: DC | PRN

## 2013-04-18 MED ORDER — IBUPROFEN 600 MG PO TABS: 600.0000 mg | ORAL_TABLET | Freq: Four times a day (QID) | ORAL | Status: DC | PRN

## 2013-04-18 MED ORDER — OXYCODONE-ACETAMINOPHEN 5-325 MG PO TABS: 1.0000 | ORAL_TABLET | Freq: Four times a day (QID) | ORAL | Status: DC | PRN

## 2013-04-18 NOTE — Lactation Note (Addendum)
This note was copied from the chart of Caroline Sharolyn Douglasnne Ranta. Lactation Consultation Note   Follow up consult with this mom of a term baby, now 4878 hours old. Mom formula fed the baby through the night. Today, her breasts are full, leaking transitional milk. I had her practice applying her nipple shield, and reviewed latching in football hold. The baby had strong suckles with lots of audible swallows and ong jaw pulls. He fed for over 20 minutes, and there was milk in the shield. [facility] I advised mom to just breast feed now, avoid pacifiers and formula. Mom and baby should be discharged to home today. Mom is aware she can call lactation for questions/concerns and o/p conults prn.  I rented mom a DEP and insstructed her in it's use.Application of nipple shield reviewed, and mom demonstrated with good technique. Hand pump and instruction alosogiven to mom.  Patient Name: Caroline Friedman ZOXWR'UToday's Date: 04/18/2013 Reason for consult: Follow-up assessment   Maternal Data    Feeding Feeding Type: Breast Fed Length of feed: 22 min  LATCH Score/Interventions Latch: Grasps breast easily, tongue down, lips flanged, rhythmical sucking. (with 24 nipple shield) Intervention(s): Assist with latch  Audible Swallowing: Spontaneous and intermittent Intervention(s): Skin to skin  Type of Nipple: Flat  Comfort (Breast/Nipple): Soft / non-tender  Problem noted: Filling  Hold (Positioning): Assistance needed to correctly position infant at breast and maintain latch. Intervention(s): Breastfeeding basics reviewed;Support Pillows;Position options  LATCH Score: 8  Lactation Tools Discussed/Used Nipple shield size: 24   Consult Status Consult Status: Complete Follow-up type: Call as needed    Caroline Friedman, Caroline Friedman 04/18/2013, 10:36 AM

## 2013-04-18 NOTE — Discharge Summary (Signed)
Obstetric Discharge Summary Reason for Admission:  38 y.o. G8P0070 at 39 wks for PCS for breech and macrosomia. Pt undergoing surgery through a heparin window. Pt notes onset contractions this am, got stronger and closer through the night . Good fetal movement, scant vaginal bleeding, not leaking fluid. Last ate 7p, last heparin 10,000 units, about 19 hrs ago.  [facility]  - dated by first trimester u/s  - RPL. MAB x 3, also TOP x 4 (unknown to partner). W/u revealed arcuate uterus, MTHFR heterozygous and nl homocyteine level, on folic acid replacment; and PAI overactivity- homozygous, for which REI consultation recommended heparin w/ pos SPT, conversion to Lovenox in the 2nd trimester and back to heparin close to term.  - Breech presentation. Declines version. 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 AFI 22 at 36 wks, growth 6'11, 84%   Prenatal Procedures: NST and ultrasound Intrapartum Procedures: cesarean: low cervical, transverse Postpartum Procedures: none Complications-Operative and Postpartum: none Hemoglobin  Date Value Range Status  04/16/2013 11.3* 12.0 - 15.0 g/dL Final     HCT  Date Value Range Status  04/16/2013 32.9* 36.0 - 46.0 % Final    Physical Exam:  General: alert, cooperative and no distress Lochia: appropriate Uterine Fundus: firm Incision: healing well DVT Evaluation: No evidence of DVT seen on physical exam. Negative Homan's sign.  Discharge Diagnoses: G8 P1 0 7 1 s/p C/S at 39wks; hx MTHFR; to f/u with hematology.    Infant found to be in vertex presentation during C/S; pt desired primary c/s even if vertex, therefore no confirmation of presentation indicated.  Discharge Information: Date: 04/18/2013 Activity: pelvic rest Diet: routine Medications: PNV, Ibuprofen, Colace, Iron and Baby  ASA Condition: stable Instructions: refer to practice specific booklet Discharge to: home Follow-up Information   [facility] In 6 weeks.   Specialty:  Obstetrics and Gynecology   Contact information:   Nelda Severe[address] [place] [address] [phone]       Newborn Data: Live born female 04/15/13 Birth Weight: 7 lb 5.9 oz (3342 g) APGAR: 9, 9  Home with mother.  Akera Snowberger K 04/18/2013, 10:43 AM

## 2013-04-18 NOTE — Progress Notes (Signed)
Pt discharged to home with husband and newborn.  Condition stable.  Pt ambulated to car with L. Ilsa IhaSnyder, NT.  No equipment for home ordered at discharge.

## 2013-04-18 NOTE — Progress Notes (Signed)
Patient ID: Caroline Friedman, female   DOB: Jan 21, 1976, 38 y.o.   MRN: [id number] POD # 3  Subjective: Pt reports feeling well and eager for d/c home/ Pain controlled with ibuprofen and percocet Tolerating po/Voiding without problems/ No n/v/Flatus pos Activity: out of bed and ambulate Bleeding is light Newborn info:  Information for the patient's newborn:  Caroline Friedman, Caroline Friedman [[id number]][[id number]]  female  / circ performed by Dr Ernestina PennaFogleman yesterday/ Feeding: breast   Objective: VS: Blood pressure 124/78, pulse 74, temperature 97.7 F (36.5 C), temperature source Oral, resp. rate 18   LABS:  Recent Labs  04/16/13 0550  WBC 17.2*  HGB 11.3*  PLT 247                             Physical Exam:  General: alert, cooperative and no distress CV: Regular rate and rhythm Resp: clear Abdomen: soft, nontender, normal bowel sounds Incision:  tegaderm dressing removed d/t sm area of old dry drainage.  Incision w/ subcuticular closure and well approximated.  Small amt ecchymosis noted Uterine Fundus: firm, below umbilicus, nontender Lochia: minimal Ext: Homans sign is negative, no sign of DVT and no edema, redness or tenderness in the calves or thighs    A/P: POD # 3/ G8P1071/ S/P C/Section d/t breech, w/hx MTHFR w/thrombophilia Thrombophilia stable and plt> 200 Doing well and stable for discharge home RX's: Ibuprofen 600mg  po Q 6 hrs prn pain #30 Refill x 1 Percocet 5/325 1 - 2 tabs po every 6 hrs prn pain  #30 No refill Colace 100mg  po up to TID prn #30 Ref x 1 Baby ASA QD Rt pp visit in 6 weeks    Signed: Demetrius RevelFISHER,Fianna Snowball K, MSN, [facility]NP 04/18/2013, 10:41 AM

## 2013-05-19 ENCOUNTER — Encounter (HOSPITAL_COMMUNITY)
Admission: RE | Admit: 2013-05-19 | Discharge: 2013-05-19 | Disposition: A | Discharge status: Home / Self Care | Payer: BC Managed Care – PPO | Source: Ambulatory Visit | Attending: Obstetrics | Admitting: Obstetrics

## 2013-05-19 DIAGNOSIS — O923 Agalactia: Secondary | ICD-10-CM | POA: Insufficient documentation

## 2013-06-19 ENCOUNTER — Encounter (HOSPITAL_COMMUNITY)
Admission: RE | Admit: 2013-06-19 | Discharge: 2013-06-19 | Disposition: A | Discharge status: Home / Self Care | Payer: BC Managed Care – PPO | Source: Ambulatory Visit | Attending: Obstetrics | Admitting: Obstetrics

## 2013-06-19 DIAGNOSIS — O923 Agalactia: Secondary | ICD-10-CM | POA: Insufficient documentation

## 2013-07-19 ENCOUNTER — Encounter (HOSPITAL_COMMUNITY)
Admission: RE | Admit: 2013-07-19 | Discharge: 2013-07-19 | Disposition: A | Discharge status: Home / Self Care | Payer: BC Managed Care – PPO | Source: Ambulatory Visit | Attending: Obstetrics | Admitting: Obstetrics

## 2013-07-19 DIAGNOSIS — O923 Agalactia: Secondary | ICD-10-CM | POA: Insufficient documentation

## 2013-08-19 ENCOUNTER — Encounter (HOSPITAL_COMMUNITY)
Admission: RE | Admit: 2013-08-19 | Discharge: 2013-08-19 | Disposition: A | Discharge status: Home / Self Care | Payer: BC Managed Care – PPO | Source: Ambulatory Visit | Attending: Obstetrics | Admitting: Obstetrics

## 2013-08-19 DIAGNOSIS — O923 Agalactia: Secondary | ICD-10-CM | POA: Insufficient documentation

## 2013-11-05 ENCOUNTER — Telehealth: Payer: Self-pay | Admitting: Hematology and Oncology

## 2013-11-05 NOTE — Telephone Encounter (Signed)
Left pt vm in ref to np appt. °

## 2013-11-07 ENCOUNTER — Telehealth: Payer: Self-pay | Admitting: Hematology and Oncology

## 2013-11-07 NOTE — Telephone Encounter (Signed)
Called pt left vm in ref to np appt. °

## 2013-11-22 ENCOUNTER — Telehealth: Payer: Self-pay | Admitting: Hematology and Oncology

## 2013-11-22 NOTE — Telephone Encounter (Signed)
S/W PATIENT AND GAVE NP APPT FOR 09/15 @ 10 W/DR. GORSUCH.

## 2013-12-03 ENCOUNTER — Telehealth: Payer: Self-pay | Admitting: Hematology and Oncology

## 2013-12-03 ENCOUNTER — Ambulatory Visit (HOSPITAL_BASED_OUTPATIENT_CLINIC_OR_DEPARTMENT_OTHER): Payer: BC Managed Care – PPO

## 2013-12-03 ENCOUNTER — Encounter: Payer: Self-pay | Admitting: Hematology and Oncology

## 2013-12-03 ENCOUNTER — Ambulatory Visit (HOSPITAL_BASED_OUTPATIENT_CLINIC_OR_DEPARTMENT_OTHER): Payer: BC Managed Care – PPO | Admitting: Hematology and Oncology

## 2013-12-03 VITALS — BP 116/79 | HR 91 | Temp 98.9°F | Resp 18 | Ht 63.0 in | Wt 139.8 lb

## 2013-12-03 DIAGNOSIS — F17200 Nicotine dependence, unspecified, uncomplicated: Secondary | ICD-10-CM

## 2013-12-03 DIAGNOSIS — Z1589 Genetic susceptibility to other disease: Secondary | ICD-10-CM

## 2013-12-03 DIAGNOSIS — E7212 Methylenetetrahydrofolate reductase deficiency: Secondary | ICD-10-CM

## 2013-12-03 DIAGNOSIS — Z7189 Other specified counseling: Secondary | ICD-10-CM

## 2013-12-03 DIAGNOSIS — E7210 Disorders of sulfur-bearing amino-acid metabolism, unspecified: Secondary | ICD-10-CM

## 2013-12-03 DIAGNOSIS — D6859 Other primary thrombophilia: Secondary | ICD-10-CM | POA: Insufficient documentation

## 2013-12-03 DIAGNOSIS — Z716 Tobacco abuse counseling: Secondary | ICD-10-CM | POA: Insufficient documentation

## 2013-12-03 DIAGNOSIS — N96 Recurrent pregnancy loss: Secondary | ICD-10-CM | POA: Insufficient documentation

## 2013-12-03 NOTE — Assessment & Plan Note (Addendum)
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 She was never diagnosed with history of blood clots. She has family history of thrombosis. In any case, there is no indication for her to be on any form of prophylactic anticoagulation treatment. Should she get pregnant again, I would recommend Lovenox injection right away until her third trimester of pregnancy. At about 36 weeks, I typically would switch over to heparin injection. I would be happy to manage DVT prophylaxis for her should she gets pregnant again.

## 2013-12-03 NOTE — Telephone Encounter (Signed)
gv and printed pt avs °

## 2013-12-03 NOTE — Assessment & Plan Note (Signed)
Certainly, Her thrombophilic history would put her at risk of recurrent pregnancy loss. I recommend folic acid supplementation now.

## 2013-12-03 NOTE — Assessment & Plan Note (Signed)
I spent some time counseling the patient the importance of tobacco cessation. she is currently attempting to quit on her own 

## 2013-12-03 NOTE — Progress Notes (Signed)
[facility] CONSULT NOTE  Patient Care Team: Georgann Housekeeper, MD as PCP - General (Internal Medicine) Artis Delay, MD as Consulting Physician (Hematology and Oncology) Alphonsus Sias. 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 female is here because of history of the above. The patient had for early miscarriages in the past. She never had workup of conception tested for chromosomal abnormalities. She was subsequently referred to a specialist who had additional blood work performed. She tested positive for MTHFR mutation and plasminogen activator inhibitor mutation. When she got pregnant recently, she was on Lovenox injection and transitioned to heparin. She had no complications from anticoagulation therapy. She subsequently delivered a healthy baby boy through C-section at 39 weeks. The patient also has significant tobacco abuse. She was never diagnosed with blood clots. The father was recently diagnosed with blood clots and she was hence referred here. She was exposed to birth control pill for almost 10 years in the past and never developed any form of blood clots.  MEDICAL HISTORY:  Past Medical History  Diagnosis Date  . Blood dyscrasia     thrombophillia  . Postpartum care following cesarean delivery (1/26) 04/15/2013    SURGICAL HISTORY: Past Surgical History  Procedure Laterality Date  . Appendectomy    . Breast surgery      lift and augmentation  . Cesarean section N/A 04/15/2013    Procedure: Primary CESAREAN SECTION;  Surgeon: Tresa Endo A. Ernestina Penna, MD;  Location: [facility];  Service: Obstetrics;  Laterality: N/A;  EDD: 04/20/13    SOCIAL HISTORY: History   Social History  . Marital Status: Married    Spouse Name: N/A    Number of  Children: N/A  . Years of Education: N/A   Occupational History  . Not on file.   Social History Main Topics  . Smoking status: Former Games developer  . Smokeless tobacco: Current User  . Alcohol Use: No  . Drug Use: Yes  . Sexual Activity: Not on file   Other Topics Concern  . Not on file   Social History Narrative  . No narrative on file    FAMILY HISTORY: Family History  Problem Relation Age of Onset  . Clotting disorder Father     ALLERGIES:  has No Known Allergies.  MEDICATIONS:  Current Outpatient Prescriptions  Medication Sig Dispense Refill  . aspirin 81 MG tablet Take 81 mg by mouth daily.      . Prenat w/o A-FeCbGl-DSS-FA-DHA (CITRANATAL ASSURE) 300 MG MISC Take by mouth daily.      . valACYclovir (VALTREX) 500 MG tablet Take 500 mg by mouth as needed.       No current facility-administered medications for this visit.    REVIEW OF SYSTEMS:   Constitutional: Denies fevers, chills or abnormal night sweats Eyes: Denies blurriness of vision, double vision or watery eyes Ears, nose, mouth, throat, and face: Denies mucositis or sore throat Respiratory: Denies cough, dyspnea or wheezes Cardiovascular: Denies palpitation, chest discomfort or lower extremity swelling Gastrointestinal:  Denies nausea, heartburn or change in bowel habits Skin: Denies abnormal skin rashes Lymphatics: Denies new lymphadenopathy or easy bruising Neurological:Denies numbness, tingling or new weaknesses Behavioral/Psych: Mood is stable, no new changes  All other systems were reviewed with the patient and are negative.  PHYSICAL EXAMINATION: ECOG PERFORMANCE STATUS: 0 - Asymptomatic  Filed Vitals:   12/03/13 1029  BP: 116/79  Pulse: 91  Temp: 98.9 F (37.2 C)  Resp: 18   Filed Weights   12/03/13 1029  Weight: 139 lb 12.8 oz (63.413 kg)    GENERAL:alert, no distress and comfortable SKIN: skin color, texture, turgor are normal, no rashes or significant lesions EYES: normal,  conjunctiva are pink and non-injected, sclera clear OROPHARYNX:no exudate, no erythema and lips, buccal mucosa, and tongue normal  NECK: supple, thyroid normal size, non-tender, without nodularity LYMPH:  no palpable lymphadenopathy in the cervical, axillary or inguinal LUNGS: clear to auscultation and percussion with normal breathing effort HEART: regular rate & rhythm and no murmurs and no lower extremity edema ABDOMEN:abdomen soft, non-tender and normal bowel sounds Musculoskeletal:no cyanosis of digits and no clubbing  PSYCH: alert & oriented x 3 with fluent speech NEURO: no focal motor/sensory deficits  LABORATORY DATA:  I have reviewed the data as listed Lab Results  Component Value Date   WBC 17.2* 04/16/2013   HGB 11.3* 04/16/2013   HCT 32.9* 04/16/2013   MCV 88.2 04/16/2013   PLT 247 04/16/2013   No results found for this basename: NA, K, CL, CO2, GLUCOSE, BUN, CREATININE, CALCIUM, GFRNONAA, GFRAA, PROT, ALBUMIN, AST, ALT, ALKPHOS, BILITOT, BILIDIR, IBILI,  in the last 8760 hours  ASSESSMENT & PLAN:  Thrombophilia 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 She was never diagnosed with history of blood clots. She has family history of thrombosis. In any case, there is no indication for her to be on any form of prophylactic anticoagulation treatment. Should she get pregnant again, I would recommend Lovenox injection right away until her third trimester of pregnancy. At about 36 weeks, I typically would switch over to heparin injection. I would be happy to manage DVT prophylaxis for her should she gets pregnant again.  History of recurrent miscarriages, not currently pregnant Certainly, Her thrombophilic history would put her at risk of recurrent pregnancy loss. I recommend folic acid supplementation now.  MTHFR mutation There is risk of coronary artery disease with this  medication. I recommend 1 mg folic acid supplement indefinitely and 81 mg aspirin.  Tobacco abuse counseling I spent some time counseling the patient the importance of tobacco cessation. she is currently attempting to quit on her own       All questions were answered. The patient knows to call the clinic with any problems, questions or concerns. I spent 40 minutes counseling the patient face to face. The total time spent in the appointment was 55 minutes and more than 50% was on counseling.     [facility], Marek Nghiem, MD 12/03/2013 4:39 PM

## 2013-12-03 NOTE — Assessment & Plan Note (Signed)
There is risk of coronary artery disease with this medication. I recommend 1 mg folic acid supplement indefinitely and 81 mg aspirin.

## 2014-01-02 ENCOUNTER — Encounter (HOSPITAL_COMMUNITY): Payer: BC Managed Care – PPO

## 2014-01-09 ENCOUNTER — Other Ambulatory Visit (HOSPITAL_COMMUNITY): Payer: Self-pay | Admitting: Internal Medicine

## 2014-01-09 ENCOUNTER — Ambulatory Visit (HOSPITAL_COMMUNITY)
Admission: RE | Admit: 2014-01-09 | Discharge: 2014-01-09 | Disposition: A | Discharge status: Home / Self Care | Payer: BC Managed Care – PPO | Source: Ambulatory Visit | Attending: Internal Medicine | Admitting: Internal Medicine

## 2014-01-09 DIAGNOSIS — M79605 Pain in left leg: Secondary | ICD-10-CM | POA: Insufficient documentation

## 2014-01-09 DIAGNOSIS — M79609 Pain in unspecified limb: Secondary | ICD-10-CM

## 2014-01-09 DIAGNOSIS — M25562 Pain in left knee: Secondary | ICD-10-CM | POA: Insufficient documentation

## 2014-01-10 NOTE — Progress Notes (Signed)
*  Preliminary Results* Left lower extremity venous duplex completed. Left lower extremity is negative for deep vein thrombosis. There is no evidence of left Baker's cyst.  01/10/2014 11:39 AM  Gertie FeyMichelle Terran Klinke, RVT, RDCS, RDMS

## 2014-01-20 ENCOUNTER — Encounter: Payer: Self-pay | Admitting: Hematology and Oncology

## 2014-02-11 ENCOUNTER — Other Ambulatory Visit: Payer: Self-pay | Admitting: Internal Medicine

## 2014-02-11 DIAGNOSIS — J40 Bronchitis, not specified as acute or chronic: Secondary | ICD-10-CM | POA: Insufficient documentation

## 2014-02-11 DIAGNOSIS — Z87891 Personal history of nicotine dependence: Secondary | ICD-10-CM | POA: Insufficient documentation

## 2014-02-11 DIAGNOSIS — Z720 Tobacco use: Secondary | ICD-10-CM

## 2014-05-19 LAB — OB RESULTS CONSOLE RPR: RPR: NONREACTIVE

## 2014-05-19 LAB — OB RESULTS CONSOLE ANTIBODY SCREEN: Antibody Screen: NEGATIVE

## 2014-05-19 LAB — OB RESULTS CONSOLE ABO/RH: RH Type: POSITIVE

## 2014-05-19 LAB — OB RESULTS CONSOLE HEPATITIS B SURFACE ANTIGEN: Hepatitis B Surface Ag: NEGATIVE

## 2014-05-19 LAB — OB RESULTS CONSOLE HIV ANTIBODY (ROUTINE TESTING): HIV: NONREACTIVE

## 2014-05-19 LAB — OB RESULTS CONSOLE RUBELLA ANTIBODY, IGM: Rubella: IMMUNE

## 2014-05-22 LAB — OB RESULTS CONSOLE GC/CHLAMYDIA
Chlamydia: NEGATIVE
Gonorrhea: NEGATIVE

## 2014-10-10 ENCOUNTER — Other Ambulatory Visit: Payer: Self-pay | Admitting: Obstetrics

## 2014-12-04 ENCOUNTER — Encounter (HOSPITAL_COMMUNITY): Payer: Self-pay

## 2014-12-05 ENCOUNTER — Encounter (HOSPITAL_COMMUNITY): Payer: Self-pay

## 2014-12-05 ENCOUNTER — Encounter (HOSPITAL_COMMUNITY)
Admission: RE | Admit: 2014-12-05 | Discharge: 2014-12-05 | Disposition: A | Discharge status: Home / Self Care | Payer: BC Managed Care – PPO | Source: Ambulatory Visit | Attending: Obstetrics | Admitting: Obstetrics

## 2014-12-05 LAB — CBC
HCT: 33.3 % — ABNORMAL LOW (ref 36.0–46.0)
Hemoglobin: 11.6 g/dL — ABNORMAL LOW (ref 12.0–15.0)
MCH: 29.2 pg (ref 26.0–34.0)
MCHC: 34.8 g/dL (ref 30.0–36.0)
MCV: 83.9 fL (ref 78.0–100.0)
Platelets: 239 10*3/uL (ref 150–400)
RBC: 3.97 MIL/uL (ref 3.87–5.11)
RDW: 13.2 % (ref 11.5–15.5)
WBC: 9.1 10*3/uL (ref 4.0–10.5)

## 2014-12-05 LAB — TYPE AND SCREEN
ABO/RH(D): A POS
Antibody Screen: NEGATIVE

## 2014-12-05 NOTE — Patient Instructions (Signed)
Your procedure is scheduled on:  December 08, 2014  Enter through the Main Entrance of [facility] at:  10:45 am   Pick up the phone at the desk and dial [phone].  Call this number if you have problems the morning of surgery: [phone].  Remember: Do NOT eat food:  After midnight on Sunday  Do NOT drink clear liquids after:  8:15 am day of surgery  Take these medicines the morning of surgery with a SIP OF WATER:  None     Do NOT wear jewelry (body piercing), metal hair clips/bobby pins, or nail polish. Do NOT wear lotions, powders, or perfumes.  You may wear deoderant. Do NOT shave for 48 hours prior to surgery. Do NOT bring valuables to the hospital. Leave suitcase in car.  After surgery it may be brought to your room.  For patients admitted to the hospital, checkout time is 11:00 AM the day of discharge.

## 2014-12-06 LAB — RPR: RPR Ser Ql: NONREACTIVE

## 2014-12-07 NOTE — H&P (Signed)
Caroline Friedman is a 39 y.o. [phone] at [redacted]w[redacted]d presenting for RCS. Pt notes no contractions. Good fetal movement, No vaginal bleeding, not leaking fluid.  [facility]are at [facility] since 5 wks - Dated by LMP c/w 5/7 wk u/s - PAI/ MTHFR heterozygote/ RPL- successful preg last yr on heparin and Lovenox, doses repeated this preg - AMA, nl fetal testing in past month, Nl NT, nl Informaseq - Arcuate Uterus - Prior c/s for breech/ macrosomia   Prenatal Transfer Tool  Maternal Diabetes: No Genetic Screening: Normal Maternal Ultrasounds/Referrals: Normal Fetal Ultrasounds or other Referrals:  None Maternal Substance Abuse:  No Significant Maternal Medications:  None Significant Maternal Lab Results: None     OB History    Gravida Para Term Preterm AB TAB SAB Ectopic Multiple Living   Past Medical History  Diagnosis Date  . Blood dyscrasia     thrombophillia  . Postpartum care following cesarean delivery (1/26) 04/15/2013   Past Surgical History  Procedure Laterality Date  . Appendectomy    . Breast surgery      lift and augmentation  . Cesarean section N/A 04/15/2013    Procedure: Primary CESAREAN SECTION;  Surgeon: Tresa Endo A. Ernestina Penna, MD;  Location: [facility];  Service: Obstetrics;  Laterality: N/A;  EDD: 04/20/13   Family History: family history includes Clotting disorder in her father. Social History:  reports that she has quit smoking. She uses smokeless tobacco. She reports that she uses illicit drugs. She reports that she does not drink alcohol.  Review of Systems - Negative except discomfort of preg     Last menstrual period 03/08/2014, unknown if currently breastfeeding.  Physical Exam:  Filed Vitals:   12/08/14 1101  BP: 105/71  Pulse: 90  Temp: 97.9 F (36.6 C)  TempSrc: Oral  Resp: 18  SpO2: 100%    Gen: well appearing, no distress  Back: no CVAT Abd: gravid, NT, no RUQ pain LE: no edema, equal bilaterally, non-tender   Prenatal  labs: ABO, Rh: --/--/A POS (09/16 1605) Antibody: NEG (09/16 1605) Rubella:  immune RPR: Non Reactive (09/16 1605)  HBsAg: Negative (02/29 0000)  HIV: Non-reactive (02/29 0000)  GBS:   neg 1 hr Glucola 99  Genetic screening nl Informaseq, nl NT Anatomy US normal  CBC    Component Value Date/Time   WBC 9.1 12/05/2014 1605   RBC 3.97 12/05/2014 1605   HGB 11.6* 12/05/2014 1605   HCT 33.3* 12/05/2014 1605   PLT 239 12/05/2014 1605   MCV 83.9 12/05/2014 1605   MCH 29.2 12/05/2014 1605   MCHC 34.8 12/05/2014 1605   RDW 13.2 12/05/2014 1605     Assessment/Plan: 39 y.o. [id number] at [redacted]w[redacted]d - Elective RCS - AMA - h/o HSV, on Valtrex prophylaxis - heparin use, stopped 24 hrs ago, watch for bleeding, no CI to blood if needed   Hettie Roselli A. 12/07/2014, 9:18 PM

## 2014-12-08 ENCOUNTER — Encounter (HOSPITAL_COMMUNITY): Payer: Self-pay

## 2014-12-08 ENCOUNTER — Inpatient Hospital Stay (HOSPITAL_COMMUNITY): Payer: BC Managed Care – PPO | Admitting: Anesthesiology

## 2014-12-08 ENCOUNTER — Inpatient Hospital Stay (HOSPITAL_COMMUNITY)
Admission: RE | Admit: 2014-12-08 | Discharge: 2014-12-11 | DRG: 765 | Disposition: A | Discharge status: Home / Self Care | Payer: BC Managed Care – PPO | Source: Ambulatory Visit | Attending: Obstetrics | Admitting: Obstetrics

## 2014-12-08 ENCOUNTER — Encounter (HOSPITAL_COMMUNITY)
Admission: RE | Disposition: A | Discharge status: Home / Self Care | Payer: Self-pay | Source: Ambulatory Visit | Attending: Obstetrics

## 2014-12-08 DIAGNOSIS — D62 Acute posthemorrhagic anemia: Secondary | ICD-10-CM | POA: Diagnosis present

## 2014-12-08 DIAGNOSIS — O9912 Other diseases of the blood and blood-forming organs and certain disorders involving the immune mechanism complicating childbirth: Secondary | ICD-10-CM | POA: Diagnosis present

## 2014-12-08 DIAGNOSIS — O3421 Maternal care for scar from previous cesarean delivery: Principal | ICD-10-CM | POA: Diagnosis present

## 2014-12-08 DIAGNOSIS — O9081 Anemia of the puerperium: Secondary | ICD-10-CM | POA: Diagnosis present

## 2014-12-08 DIAGNOSIS — Z87891 Personal history of nicotine dependence: Secondary | ICD-10-CM

## 2014-12-08 DIAGNOSIS — O09523 Supervision of elderly multigravida, third trimester: Secondary | ICD-10-CM

## 2014-12-08 DIAGNOSIS — Z3A39 39 weeks gestation of pregnancy: Secondary | ICD-10-CM | POA: Diagnosis present

## 2014-12-08 DIAGNOSIS — O99019 Anemia complicating pregnancy, unspecified trimester: Secondary | ICD-10-CM | POA: Diagnosis present

## 2014-12-08 DIAGNOSIS — D6859 Other primary thrombophilia: Secondary | ICD-10-CM | POA: Diagnosis present

## 2014-12-08 DIAGNOSIS — Q51810 Arcuate uterus: Secondary | ICD-10-CM

## 2014-12-08 DIAGNOSIS — D509 Iron deficiency anemia, unspecified: Secondary | ICD-10-CM | POA: Diagnosis present

## 2014-12-08 DIAGNOSIS — Z98891 History of uterine scar from previous surgery: Secondary | ICD-10-CM

## 2014-12-08 HISTORY — DX: Personal history of nicotine dependence: Z87.891

## 2014-12-08 HISTORY — DX: Breast implant status: Z98.82

## 2014-12-08 HISTORY — DX: Anemia, unspecified: D64.9

## 2014-12-08 HISTORY — DX: Acquired absence of other specified parts of digestive tract: Z90.49

## 2014-12-08 HISTORY — DX: History of uterine scar from previous surgery: Z98.891

## 2014-12-08 SURGERY — Surgical Case
Anesthesia: Spinal | Site: Abdomen

## 2014-12-08 MED ORDER — CEFAZOLIN SODIUM-DEXTROSE 2-3 GM-% IV SOLR
INTRAVENOUS | Status: AC
  Filled 2014-12-08: qty 50

## 2014-12-08 MED ORDER — WITCH HAZEL-GLYCERIN EX PADS: 1.0000 "application " | MEDICATED_PAD | CUTANEOUS | Status: DC | PRN

## 2014-12-08 MED ORDER — LANOLIN HYDROUS EX OINT: 1.0000 "application " | TOPICAL_OINTMENT | CUTANEOUS | Status: DC | PRN

## 2014-12-08 MED ORDER — FENTANYL CITRATE (PF) 100 MCG/2ML IJ SOLN
INTRAMUSCULAR | Status: DC | PRN
  Administered 2014-12-08: 10 ug via INTRATHECAL

## 2014-12-08 MED ORDER — OXYCODONE-ACETAMINOPHEN 5-325 MG PO TABS
1.0000 | ORAL_TABLET | ORAL | Status: DC | PRN
  Administered 2014-12-09 – 2014-12-11 (×8): 1 via ORAL
  Filled 2014-12-08 (×8): qty 1

## 2014-12-08 MED ORDER — KETOROLAC TROMETHAMINE 30 MG/ML IJ SOLN: 30.0000 mg | Freq: Four times a day (QID) | INTRAMUSCULAR | Status: AC | PRN

## 2014-12-08 MED ORDER — FENTANYL CITRATE (PF) 100 MCG/2ML IJ SOLN: 25.0000 ug | INTRAMUSCULAR | Status: DC | PRN

## 2014-12-08 MED ORDER — SCOPOLAMINE 1 MG/3DAYS TD PT72
1.0000 | MEDICATED_PATCH | Freq: Once | TRANSDERMAL | Status: DC
  Administered 2014-12-08: 1.5 mg via TRANSDERMAL

## 2014-12-08 MED ORDER — SIMETHICONE 80 MG PO CHEW
80.0000 mg | CHEWABLE_TABLET | Freq: Three times a day (TID) | ORAL | Status: DC
  Administered 2014-12-08 – 2014-12-11 (×8): 80 mg via ORAL
  Filled 2014-12-08 (×8): qty 1

## 2014-12-08 MED ORDER — FOLIC ACID 1 MG PO TABS
4.0000 mg | ORAL_TABLET | Freq: Every day | ORAL | Status: DC
  Administered 2014-12-08 – 2014-12-11 (×4): 4 mg via ORAL
  Filled 2014-12-08 (×5): qty 4

## 2014-12-08 MED ORDER — PHENYLEPHRINE 8 MG IN D5W 100 ML (0.08MG/ML) PREMIX OPTIME
INJECTION | INTRAVENOUS | Status: AC
  Filled 2014-12-08: qty 100

## 2014-12-08 MED ORDER — LACTATED RINGERS IV SOLN
Freq: Once | INTRAVENOUS | Status: AC
  Administered 2014-12-08: 11:00:00 via INTRAVENOUS

## 2014-12-08 MED ORDER — ONDANSETRON HCL 4 MG/2ML IJ SOLN: 4.0000 mg | Freq: Once | INTRAMUSCULAR | Status: DC | PRN

## 2014-12-08 MED ORDER — NALBUPHINE HCL 10 MG/ML IJ SOLN: 5.0000 mg | INTRAMUSCULAR | Status: DC | PRN

## 2014-12-08 MED ORDER — SCOPOLAMINE 1 MG/3DAYS TD PT72
MEDICATED_PATCH | TRANSDERMAL | Status: AC
  Administered 2014-12-08: 1.5 mg via TRANSDERMAL
  Filled 2014-12-08: qty 1

## 2014-12-08 MED ORDER — 0.9 % SODIUM CHLORIDE (POUR BTL) OPTIME
TOPICAL | Status: DC | PRN
  Administered 2014-12-08: 1000 mL

## 2014-12-08 MED ORDER — OXYTOCIN 10 UNIT/ML IJ SOLN
40.0000 [IU] | INTRAVENOUS | Status: DC | PRN
  Administered 2014-12-08: 40 [IU] via INTRAVENOUS

## 2014-12-08 MED ORDER — ONDANSETRON HCL 4 MG/2ML IJ SOLN
INTRAMUSCULAR | Status: AC
  Filled 2014-12-08: qty 2

## 2014-12-08 MED ORDER — SODIUM CHLORIDE 0.9 % IJ SOLN: 3.0000 mL | INTRAMUSCULAR | Status: DC | PRN

## 2014-12-08 MED ORDER — ONDANSETRON HCL 4 MG/2ML IJ SOLN
INTRAMUSCULAR | Status: DC | PRN
Start: 2014-12-08 — End: 2014-12-08
  Administered 2014-12-08: 4 mg via INTRAVENOUS

## 2014-12-08 MED ORDER — OXYCODONE-ACETAMINOPHEN 5-325 MG PO TABS: 2.0000 | ORAL_TABLET | ORAL | Status: DC | PRN

## 2014-12-08 MED ORDER — LACTATED RINGERS IV SOLN
INTRAVENOUS | Status: DC
  Administered 2014-12-09: 01:00:00 via INTRAVENOUS

## 2014-12-08 MED ORDER — PHENYLEPHRINE 8 MG IN D5W 100 ML (0.08MG/ML) PREMIX OPTIME
INJECTION | INTRAVENOUS | Status: DC | PRN
  Administered 2014-12-08: 60 ug/min via INTRAVENOUS

## 2014-12-08 MED ORDER — PHENYLEPHRINE HCL 10 MG/ML IJ SOLN
INTRAMUSCULAR | Status: DC | PRN
  Administered 2014-12-08: 80 ug via INTRAVENOUS

## 2014-12-08 MED ORDER — SCOPOLAMINE 1 MG/3DAYS TD PT72: 1.0000 | MEDICATED_PATCH | Freq: Once | TRANSDERMAL | Status: DC

## 2014-12-08 MED ORDER — DIPHENHYDRAMINE HCL 50 MG/ML IJ SOLN: 12.5000 mg | INTRAMUSCULAR | Status: DC | PRN

## 2014-12-08 MED ORDER — DIPHENHYDRAMINE HCL 25 MG PO CAPS: 25.0000 mg | ORAL_CAPSULE | Freq: Four times a day (QID) | ORAL | Status: DC | PRN

## 2014-12-08 MED ORDER — VALACYCLOVIR HCL 500 MG PO TABS
500.0000 mg | ORAL_TABLET | Freq: Every day | ORAL | Status: DC
  Filled 2014-12-08: qty 1

## 2014-12-08 MED ORDER — ONDANSETRON HCL 4 MG/2ML IJ SOLN: 4.0000 mg | Freq: Three times a day (TID) | INTRAMUSCULAR | Status: DC | PRN

## 2014-12-08 MED ORDER — SIMETHICONE 80 MG PO CHEW
80.0000 mg | CHEWABLE_TABLET | ORAL | Status: DC
  Administered 2014-12-09 – 2014-12-10 (×3): 80 mg via ORAL
  Filled 2014-12-08 (×3): qty 1

## 2014-12-08 MED ORDER — NALOXONE HCL 1 MG/ML IJ SOLN: 1.0000 ug/kg/h | INTRAVENOUS | Status: DC | PRN

## 2014-12-08 MED ORDER — MEPERIDINE HCL 25 MG/ML IJ SOLN: 6.2500 mg | INTRAMUSCULAR | Status: DC | PRN

## 2014-12-08 MED ORDER — DEXAMETHASONE SODIUM PHOSPHATE 4 MG/ML IJ SOLN
INTRAMUSCULAR | Status: DC | PRN
  Administered 2014-12-08: 4 mg via INTRAVENOUS

## 2014-12-08 MED ORDER — NALBUPHINE HCL 10 MG/ML IJ SOLN: 5.0000 mg | Freq: Once | INTRAMUSCULAR | Status: DC | PRN

## 2014-12-08 MED ORDER — LACTATED RINGERS IV SOLN
INTRAVENOUS | Status: DC
  Administered 2014-12-08 (×2): via INTRAVENOUS

## 2014-12-08 MED ORDER — FENTANYL CITRATE (PF) 100 MCG/2ML IJ SOLN
INTRAMUSCULAR | Status: AC
  Filled 2014-12-08: qty 4

## 2014-12-08 MED ORDER — MORPHINE SULFATE (PF) 0.5 MG/ML IJ SOLN
INTRAMUSCULAR | Status: DC | PRN
  Administered 2014-12-08: .2 mg via INTRATHECAL

## 2014-12-08 MED ORDER — BUPIVACAINE IN DEXTROSE 0.75-8.25 % IT SOLN
INTRATHECAL | Status: DC | PRN
  Administered 2014-12-08: 1.6 mL via INTRATHECAL

## 2014-12-08 MED ORDER — CEFAZOLIN SODIUM-DEXTROSE 2-3 GM-% IV SOLR
2.0000 g | INTRAVENOUS | Status: AC
  Administered 2014-12-08: 2 g via INTRAVENOUS

## 2014-12-08 MED ORDER — SIMETHICONE 80 MG PO CHEW: 80.0000 mg | CHEWABLE_TABLET | ORAL | Status: DC | PRN

## 2014-12-08 MED ORDER — NALOXONE HCL 0.4 MG/ML IJ SOLN: 0.4000 mg | INTRAMUSCULAR | Status: DC | PRN

## 2014-12-08 MED ORDER — OXYTOCIN 10 UNIT/ML IJ SOLN
INTRAMUSCULAR | Status: AC
  Filled 2014-12-08: qty 4

## 2014-12-08 MED ORDER — ACETAMINOPHEN 325 MG PO TABS: 650.0000 mg | ORAL_TABLET | ORAL | Status: DC | PRN

## 2014-12-08 MED ORDER — MORPHINE SULFATE (PF) 0.5 MG/ML IJ SOLN
INTRAMUSCULAR | Status: AC
Start: 2014-12-08 — End: 2014-12-08
  Filled 2014-12-08: qty 100

## 2014-12-08 MED ORDER — TETANUS-DIPHTH-ACELL PERTUSSIS 5-2.5-18.5 LF-MCG/0.5 IM SUSP
0.5000 mL | Freq: Once | INTRAMUSCULAR | Status: DC
Start: 2014-12-09 — End: 2014-12-09

## 2014-12-08 MED ORDER — MENTHOL 3 MG MT LOZG: 1.0000 | LOZENGE | OROMUCOSAL | Status: DC | PRN

## 2014-12-08 MED ORDER — LACTATED RINGERS IV SOLN
INTRAVENOUS | Status: DC | PRN
  Administered 2014-12-08: 13:00:00 via INTRAVENOUS

## 2014-12-08 MED ORDER — DIPHENHYDRAMINE HCL 25 MG PO CAPS: 25.0000 mg | ORAL_CAPSULE | ORAL | Status: DC | PRN

## 2014-12-08 MED ORDER — DIBUCAINE 1 % RE OINT: 1.0000 "application " | TOPICAL_OINTMENT | RECTAL | Status: DC | PRN

## 2014-12-08 MED ORDER — PRENATAL MULTIVITAMIN CH
1.0000 | ORAL_TABLET | Freq: Every day | ORAL | Status: DC
  Administered 2014-12-09 – 2014-12-11 (×3): 1 via ORAL
  Filled 2014-12-08 (×3): qty 1

## 2014-12-08 MED ORDER — IBUPROFEN 600 MG PO TABS
600.0000 mg | ORAL_TABLET | Freq: Four times a day (QID) | ORAL | Status: DC
  Administered 2014-12-08 – 2014-12-11 (×12): 600 mg via ORAL
  Filled 2014-12-08 (×12): qty 1

## 2014-12-08 MED ORDER — INFLUENZA VAC SPLIT QUAD 0.5 ML IM SUSY
0.5000 mL | PREFILLED_SYRINGE | INTRAMUSCULAR | Status: AC
  Administered 2014-12-11: 0.5 mL via INTRAMUSCULAR

## 2014-12-08 MED ORDER — OXYTOCIN 40 UNITS IN LACTATED RINGERS INFUSION - SIMPLE MED: 62.5000 mL/h | INTRAVENOUS | Status: AC

## 2014-12-08 MED ORDER — SENNOSIDES-DOCUSATE SODIUM 8.6-50 MG PO TABS
2.0000 | ORAL_TABLET | ORAL | Status: DC
  Administered 2014-12-09 – 2014-12-10 (×3): 2 via ORAL
  Filled 2014-12-08 (×3): qty 2

## 2014-12-08 MED ORDER — DEXAMETHASONE SODIUM PHOSPHATE 4 MG/ML IJ SOLN
INTRAMUSCULAR | Status: AC
  Filled 2014-12-08: qty 1

## 2014-12-08 MED ORDER — ZOLPIDEM TARTRATE 5 MG PO TABS: 5.0000 mg | ORAL_TABLET | Freq: Every evening | ORAL | Status: DC | PRN

## 2014-12-08 SURGICAL SUPPLY — 26 items
BENZOIN TINCTURE PRP APPL 2/3 (GAUZE/BANDAGES/DRESSINGS) ×2 IMPLANT
CLAMP CORD UMBIL (MISCELLANEOUS) ×2 IMPLANT
CLOTH BEACON ORANGE TIMEOUT ST (SAFETY) ×2 IMPLANT
DRAPE SHEET LG 3/4 BI-LAMINATE (DRAPES) ×2 IMPLANT
DRSG OPSITE POSTOP 4X10 (GAUZE/BANDAGES/DRESSINGS) ×2 IMPLANT
DURAPREP 26ML APPLICATOR (WOUND CARE) ×2 IMPLANT
ELECT REM PT RETURN 9FT ADLT (ELECTROSURGICAL) ×2
ELECTRODE REM PT RTRN 9FT ADLT (ELECTROSURGICAL) ×1 IMPLANT
GLOVE BIO SURGEON STRL SZ 6.5 (GLOVE) ×2 IMPLANT
GLOVE BIOGEL PI IND STRL 7.0 (GLOVE) ×1 IMPLANT
GLOVE BIOGEL PI INDICATOR 7.0 (GLOVE) ×1
GOWN STRL REUS W/TWL LRG LVL3 (GOWN DISPOSABLE) ×4 IMPLANT
NS IRRIG 1000ML POUR BTL (IV SOLUTION) ×2 IMPLANT
PACK C SECTION WH (CUSTOM PROCEDURE TRAY) ×2 IMPLANT
PAD OB MATERNITY 4.3X12.25 (PERSONAL CARE ITEMS) ×2 IMPLANT
PENCIL SMOKE EVAC W/HOLSTER (ELECTROSURGICAL) IMPLANT
STRIP CLOSURE SKIN 1/2X4 (GAUZE/BANDAGES/DRESSINGS) ×2 IMPLANT
SUT MON AB 4-0 PS1 27 (SUTURE) ×2 IMPLANT
SUT PLAIN 2 0 XLH (SUTURE) ×2 IMPLANT
SUT VIC AB 0 CT1 36 (SUTURE) ×8 IMPLANT
SUT VIC AB 0 CTX 36 (SUTURE) ×2
SUT VIC AB 0 CTX36XBRD ANBCTRL (SUTURE) ×2 IMPLANT
SUT VIC AB 2-0 CT1 27 (SUTURE) ×1
SUT VIC AB 2-0 CT1 TAPERPNT 27 (SUTURE) ×1 IMPLANT
TOWEL OR 17X24 6PK STRL BLUE (TOWEL DISPOSABLE) ×2 IMPLANT
TRAY FOLEY CATH SILVER 14FR (SET/KITS/TRAYS/PACK) ×2 IMPLANT

## 2014-12-08 NOTE — Progress Notes (Signed)
[facility]  Delivery Note:  C-section       12/08/2014  1:16 PM  I was called to the operating room at the request of the patient's obstetrician (Dr. Ernestina Penna) for a repeat c-section.  PRENATAL HX:  39 y/o G9P1071 at 4 and 2/7 weeks. Pregnancy complicated by AMA, previous c-section, and MTHFR heterozygote (on heparin therapy, last given yesterday).  INTRAPARTUM HX:   Repeat c-section with AROM at delivery  DELIVERY:  Infant was vigorous at delivery, requiring no resuscitation other than standard warming, drying and stimulation.  APGARs 8 and 9.  Exam within normal limits.  After 5 minutes, baby left with nurse to assist parents with skin-to-skin care.   _____________________ Electronically Signed By: Maryan Char, MD Neonatologist

## 2014-12-08 NOTE — Anesthesia Postprocedure Evaluation (Signed)
  Anesthesia Post-op Note  Patient: Caroline Friedman  Procedure(s) Performed: Procedure(s) (LRB): Repeat CESAREAN SECTION (N/A)  Patient Location: PACU  Anesthesia Type: Spinal  Level of Consciousness: awake and alert   Airway and Oxygen Therapy: Patient Spontanous Breathing  Post-op Pain: mild  Post-op Assessment: Post-op Vital signs reviewed, Patient's Cardiovascular Status Stable, Respiratory Function Stable, Patent Airway and No signs of Nausea or vomiting  Last Vitals:  Filed Vitals:   12/08/14 1503  BP:   Pulse:   Temp:   Resp: 21    Post-op Vital Signs: stable   Complications: No apparent anesthesia complications

## 2014-12-08 NOTE — Op Note (Signed)
12/08/2014  2:17 PM  PATIENT:  Caroline Friedman  39 y.o. female  PRE-OPERATIVE DIAGNOSIS:  Previous Cesarean Section, AMA, thrombophilia  POST-OPERATIVE DIAGNOSIS:  Previous Cesarean Section, AMA, thrombophilia  PROCEDURE:  Procedure(s) with comments: Repeat CESAREAN SECTION (N/A) - EDD: 12/13/14  SURGEON:  Surgeon(s) and Role:    * Noland Fordyce, MD - Primary  PHYSICIAN ASSISTANT:   ASSISTANTS: DAwson, CNM   ANESTHESIA:   epidural  EBL:  Total I/O In: 2300 [I.V.:2300] Out: 700 [Urine:100; Blood:600]  BLOOD ADMINISTERED:none  DRAINS: Urinary Catheter (Foley)   LOCAL MEDICATIONS USED:  NONE  SPECIMEN:  Source of Specimen:  Placenta  DISPOSITION OF SPECIMEN:  N/A  COUNTS:  YES  TOURNIQUET:  * No tourniquets in log *  DICTATION: .Note written in EPIC  PLAN OF CARE: Admit to inpatient   PATIENT DISPOSITION:  PACU - hemodynamically stable.   Delay start of Pharmacological VTE agent (>24hrs) due to surgical blood loss or risk of bleeding: yes   Findings:  @ infant,  APGAR (1 MIN): 8   APGAR (5 MINS): 9   APGAR (10 MINS):   Normal uterus, tubes and ovaries, normal placenta. 3VC, clear amniotic fluid  EBL: per anasthesia Antibiotics:   2g Ancef Complications: none  Indications: This is a 39 y.o. year-old, G9P1  At [redacted]w[redacted]d admitted for RCS. Risks benefits and alternatives of the procedure were discussed with the patient who agreed to proceed  Procedure:  After informed consent was obtained the patient was taken to the operating room where epidural anesthesia was initiated.  She was prepped and draped in the normal sterile fashion in dorsal supine position with a leftward tilt.  A foley catheter was in place.  A Pfannenstiel skin incision was made 2 cm above the pubic symphysis in the midline with the scalpel over the old incision.  Dissection was carried down with the Bovie cautery until the fascia was reached. The fascia was incised in the midline. The  incision was extended laterally with the Mayo scissors. The inferior aspect of the fascial incision was grasped with the Coker clamps, elevated up and the underlying rectus muscles were dissected off sharply. The superior aspect of the fascial incision was grasped with the Coker clamps elevated up and the underlying rectus muscles were dissected off sharply.  The peritoneum was entered bluntly. The peritoneal incision was extended superiorly and inferiorly with good visualization of the bladder. The bladder blade was inserted and palpation was done to assess the fetal position and the location of the uterine vessels. The lower segment of the uterus was incised sharply with the scalpel and extended  bluntly in the cephalo-caudal fashion. The infant was grasped, brought to the incision,  rotated and the infant was delivered with fundal pressure. Nuchal x 1 was reduced. The nose and mouth were bulb suctioned. The cord was clamped and cut. The infant was handed off to the waiting pediatrician. The placenta was expressed. The uterus was exteriorized. The uterus was cleared of all clots and debris. The uterine incision was repaired with 0 Vicryl in a running locked fashion.  A second layer of the same suture was used in an imbricating fashion to obtain excellent hemostasis.  The uterus was then returned to the abdomen, the gutters were cleared of all clots and debris. The uterine incision was reinspected and found to be hemostatic. The peritoneum was grasped and closed with 2-0 Vicryl in a running fashion. The cut muscle edges and the underside of the fascia  were inspected and found to be hemostatic. The fascia was closed with 0 Vicryl in two halves. The subcutaneous tissue was irrigated. Scarpa's layer was closed with a 2-0 plain gut suture. The skin was closed with a 4-0 Monocryl in a single layer. The patient tolerated the procedure well. Sponge lap and needle counts were correct x3 and patient was taken to the  recovery room in a stable condition.  Caroline Friedman A. 12/08/2014 2:19 PM

## 2014-12-08 NOTE — Brief Op Note (Signed)
12/08/2014  2:17 PM  PATIENT:  Caroline Friedman  39 y.o. female  PRE-OPERATIVE DIAGNOSIS:  Previous Cesarean Section, AMA, thrombophilia  POST-OPERATIVE DIAGNOSIS:  Previous Cesarean Section, AMA, thrombophilia  PROCEDURE:  Procedure(s) with comments: Repeat CESAREAN SECTION (N/A) - EDD: 12/13/14  SURGEON:  Surgeon(s) and Role:    * Noland Fordyce, MD - Primary  PHYSICIAN ASSISTANT:   ASSISTANTS: DAwson, CNM   ANESTHESIA:   epidural  EBL:  Total I/O In: 2300 [I.V.:2300] Out: 700 [Urine:100; Blood:600]  BLOOD ADMINISTERED:none  DRAINS: Urinary Catheter (Foley)   LOCAL MEDICATIONS USED:  NONE  SPECIMEN:  Source of Specimen:  Placenta  DISPOSITION OF SPECIMEN:  N/A  COUNTS:  YES  TOURNIQUET:  * No tourniquets in log *  DICTATION: .Note written in EPIC  PLAN OF CARE: Admit to inpatient   PATIENT DISPOSITION:  PACU - hemodynamically stable.   Delay start of Pharmacological VTE agent (>24hrs) due to surgical blood loss or risk of bleeding: yes

## 2014-12-08 NOTE — Transfer of Care (Signed)
Immediate Anesthesia Transfer of Care Note  Patient: Caroline Friedman  Procedure(s) Performed: Procedure(s) with comments: Repeat CESAREAN SECTION (N/A) - EDD: 12/13/14  Patient Location: PACU  Anesthesia Type:Spinal  Level of Consciousness: awake, alert  and oriented  Airway & Oxygen Therapy: Patient Spontanous Breathing  Post-op Assessment: Report given to RN and Post -op Vital signs reviewed and stable  Post vital signs: Reviewed and stable  Last Vitals:  Filed Vitals:   12/08/14 1101  BP: 105/71  Pulse: 90  Temp: 36.6 C  Resp: 18    Complications: No apparent anesthesia complications

## 2014-12-08 NOTE — Anesthesia Preprocedure Evaluation (Addendum)
Anesthesia Evaluation  Patient identified by MRN, date of birth, ID band Patient awake    Reviewed: Allergy & Precautions, NPO status , Patient's Chart, lab work & pertinent test results  History of Anesthesia Complications Negative for: history of anesthetic complications  Airway Mallampati: II  TM Distance: >3 FB Neck ROM: Full    Dental no notable dental hx. 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 Last heparin 9/18 at 0900 therefore >24hours   Anesthesia Other Findings   Reproductive/Obstetrics (+) Pregnancy                            Anesthesia Physical Anesthesia Plan  ASA: II  Anesthesia Plan: Spinal   Post-op Pain Management:    Induction:   Airway Management Planned:   Additional Equipment:   Intra-op Plan:   Post-operative Plan:   Informed Consent: I have reviewed the patients History and Physical, chart, labs and discussed the procedure including the risks, benefits and alternatives for the proposed anesthesia with the patient or authorized representative who has indicated his/her understanding and acceptance.   Dental advisory given  Plan Discussed with: CRNA  Anesthesia Plan Comments:         Anesthesia Quick Evaluation

## 2014-12-08 NOTE — Anesthesia Procedure Notes (Signed)
Spinal Patient location during procedure: OR Staffing Anesthesiologist: Hue Frick Performed by: anesthesiologist  Preanesthetic Checklist Completed: patient identified, site marked, surgical consent, pre-op evaluation, timeout performed, IV checked, risks and benefits discussed and monitors and equipment checked Spinal Block Patient position: sitting Prep: ChloraPrep Patient monitoring: continuous pulse ox, blood pressure and heart rate Approach: midline Location: L3-4 Injection technique: single-shot Needle Needle type: Sprotte  Needle gauge: 24 G Needle length: 9 cm Additional Notes Functioning IV was confirmed and monitors were applied. Sterile prep and drape, including hand hygiene, mask and sterile gloves were used. The patient was positioned and the spine was prepped. The skin was anesthetized with lidocaine.  Free flow of clear CSF was obtained prior to injecting local anesthetic into the CSF.  The spinal needle aspirated freely following injection.  The needle was carefully withdrawn.  The patient tolerated the procedure well. Consent was obtained prior to procedure with all questions answered and concerns addressed. Risks including but not limited to bleeding, infection, nerve damage, paralysis, failed block, inadequate analgesia, allergic reaction, high spinal, itching and headache were discussed and the patient wished to proceed.   Lemoyne Nestor, MD     

## 2014-12-09 ENCOUNTER — Encounter (HOSPITAL_COMMUNITY): Payer: Self-pay | Admitting: Obstetrics

## 2014-12-09 DIAGNOSIS — D62 Acute posthemorrhagic anemia: Secondary | ICD-10-CM | POA: Diagnosis not present

## 2014-12-09 DIAGNOSIS — O99019 Anemia complicating pregnancy, unspecified trimester: Secondary | ICD-10-CM | POA: Diagnosis present

## 2014-12-09 DIAGNOSIS — D509 Iron deficiency anemia, unspecified: Secondary | ICD-10-CM | POA: Diagnosis present

## 2014-12-09 LAB — CBC
HCT: 30.2 % — ABNORMAL LOW (ref 36.0–46.0)
Hemoglobin: 10.7 g/dL — ABNORMAL LOW (ref 12.0–15.0)
MCH: 29.9 pg (ref 26.0–34.0)
MCHC: 35.4 g/dL (ref 30.0–36.0)
MCV: 84.4 fL (ref 78.0–100.0)
Platelets: 210 10*3/uL (ref 150–400)
RBC: 3.58 MIL/uL — ABNORMAL LOW (ref 3.87–5.11)
RDW: 13.3 % (ref 11.5–15.5)
WBC: 13.7 10*3/uL — ABNORMAL HIGH (ref 4.0–10.5)

## 2014-12-09 LAB — BIRTH TISSUE RECOVERY COLLECTION (PLACENTA DONATION)

## 2014-12-09 MED ORDER — DOCUSATE SODIUM 100 MG PO CAPS
100.0000 mg | ORAL_CAPSULE | Freq: Every day | ORAL | Status: DC
  Administered 2014-12-09 – 2014-12-11 (×3): 100 mg via ORAL
  Filled 2014-12-09 (×3): qty 1

## 2014-12-09 MED ORDER — MAGNESIUM OXIDE 400 (241.3 MG) MG PO TABS
200.0000 mg | ORAL_TABLET | Freq: Every day | ORAL | Status: DC
  Administered 2014-12-09 – 2014-12-11 (×3): 200 mg via ORAL
  Filled 2014-12-09 (×4): qty 0.5

## 2014-12-09 MED ORDER — DOCUSATE SODIUM 100 MG PO CAPS: 100.0000 mg | ORAL_CAPSULE | Freq: Two times a day (BID) | ORAL | Status: DC

## 2014-12-09 NOTE — Addendum Note (Signed)
Addendum  created 12/09/14 1028 by Yolonda Kida, CRNA   Modules edited: Notes Section   Notes Section:  File: [id number]

## 2014-12-09 NOTE — Progress Notes (Addendum)
POD # 1  Subjective: Pt reports feeling ok-pain better now after emptied bladder/ Pain controlled with Motrin and Percocet Tolerating po/ Foley d/c'd and voiding without problems/ No n/v/ Flatus absent Activity: ad lib Bleeding is light Newborn info:  Information for the patient's newborn:  Agata, Lucente [[id number]]  female   Circumcision: undecided-?natural circ-waiting for Peds assessment/ Feeding: breast and bottle  Objective:  VS:  Filed Vitals:   12/09/14 0105 12/09/14 0110 12/09/14 0240 12/09/14 0530  BP: 127/80 127/81 128/79 128/80  Pulse: 63 64 65 62  Temp:   98.3 F (36.8 C) 98.4 F (36.9 C)  TempSrc:   Oral Oral  Resp: Height:      Weight:      SpO2:   96% 96%     I&O: Intake/Output      09/19 0701 - 09/20 0700 09/20 0701 - 09/21 0700   P.O. 1440    I.V. (mL/kg) 2300 (30.9)    Total Intake(mL/kg) 3740 (50.3)    Urine (mL/kg/hr) 3850    Blood 600    Total Output 4450     Net -710             Recent Labs  12/09/14 0520  WBC 13.7*  HGB 10.7*  HCT 30.2*  PLT 210    Blood type: --/--/A POS (09/16 1605) Rubella: Immune (02/29 0000)    Physical Exam:  General: alert, cooperative and no distress CV: Regular rate and rhythm Resp: CTA bilaterally Abdomen: soft, nontender, normal bowel sounds Incision: Covered with Tegaderm and honeycomb dressing; no significant drainage, edema, bruising, or erythema; well approximated with suture and steri strips. Uterine Fundus: firm, below umbilicus, nontender Lochia: minimal Ext: extremities normal, atraumatic, no cyanosis or edema and Homans sign is negative, no sign of DVT   Assessment: POD # 1/ G9P2071/ S/P C/Section d/t repeat  IDA with mild ABL anemia Doing well  Plan: Ambulate TID Continue routine post op orders Consider early discharge tomorrow  Signed: Donette Larry, Dorris Carnes, MSN, CNM 12/09/2014, 10:11 AM

## 2014-12-09 NOTE — Addendum Note (Signed)
Addendum  created 12/09/14 0755 by Yolonda Kida, CRNA   Modules edited: Notes Section   Notes Section:  Pend: [id number]

## 2014-12-09 NOTE — Progress Notes (Signed)
In patient's room and patient concerned about baby being hungry.  Explained size of babies stomach and that she has colostrum currently for baby.  Patient requested formula and stated she plans to breast feed and give baby bottles as she did her 48 month old son.  Educated patient on feeding amount with bottles, gave her feeding sheet, bottles, nipples and medicine cups for measuring out formula.  Encouraged her to put baby to the breast first and then she can give a little formula if she feels the need.  Explained how babies use different muscles in the cheek for bottle sucking as opposed to breast feeding and that babies can get "lazy" at the breast because it takes more effort to draw the milk down rather than the ease of flow with the bottle. Patient expressed understanding.

## 2014-12-09 NOTE — Progress Notes (Signed)
Went into room and patient expressed concern that baby wasn't getting enough to eat and that her nipples were getting sore already and requested a nipple shield.  Patient states that she used a nipple shield with her 22 month old son and was better able to nurse without discomfort.  Got #24 nipple shield for patient.

## 2014-12-09 NOTE — Anesthesia Postprocedure Evaluation (Signed)
  Anesthesia Post-op Note  Patient: Caroline Friedman  Procedure(s) Performed: Procedure(s) with comments: Repeat CESAREAN SECTION (N/A) - EDD: 12/13/14  Patient Location: Mother/Baby  Anesthesia Type:Spinal  Level of Consciousness: awake, alert , oriented and patient cooperative  Airway and Oxygen Therapy: Patient Spontanous Breathing  Post-op Pain: none  Post-op Assessment: Post-op Vital signs reviewed, Patient's Cardiovascular Status Stable, Respiratory Function Stable, Patent Airway, No headache, No backache and Patient able to bend at knees  Post-op Vital Signs: Reviewed and stable  Last Vitals:  Filed Vitals:   12/09/14 0530  BP: 128/80  Pulse: 62  Temp: 36.9 C  Resp: 16    Complications: No apparent anesthesia complications

## 2014-12-10 ENCOUNTER — Encounter (HOSPITAL_COMMUNITY): Payer: Self-pay | Admitting: *Deleted

## 2014-12-10 NOTE — Progress Notes (Signed)
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 S:   Feels well/pain controled/tolerating po diet/N/V none/Ambulating well/Voiding well/Flatus present  Breastfeeding  Newborn doing well  O: A&O x 3/ Filed Vitals:   12/10/14 0558  BP: 122/68  Pulse: 76  Temp: 98.6 F (37 C)  Resp: 20     Labs:  Lab Results  Component Value Date   WBC 13.7* 12/09/2014   HGB 10.7* 12/09/2014   HCT 30.2* 12/09/2014   MCV 84.4 12/09/2014   PLT 210 12/09/2014     No intake or output data in the 24 hours ending 12/10/14 1028     Lungs: clear  Heart: rcr  Abdomen: soft/Bowel sounds: present/UH 0/2 firm/Dressing: dry/Insicion: intact  Lochia: normal  Extremities: normal, no oedema           no calf pain/tenderness                         A/P: POD#2 C/S/G9P2071 Progressing well.  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  Mild Anemia.  Continue routine postop orders.  D/C tomorrow.  Genia Del MD  12/10/2014 at 10:57 am

## 2014-12-11 MED ORDER — DOCUSATE SODIUM 100 MG PO CAPS: 100.0000 mg | ORAL_CAPSULE | Freq: Every day | ORAL | Status: DC

## 2014-12-11 MED ORDER — OXYCODONE-ACETAMINOPHEN 5-325 MG PO TABS: 1.0000 | ORAL_TABLET | ORAL | Status: DC | PRN

## 2014-12-11 MED ORDER — MAGNESIUM OXIDE 400 (241.3 MG) MG PO TABS: 200.0000 mg | ORAL_TABLET | Freq: Every day | ORAL | Status: DC

## 2014-12-11 MED ORDER — IBUPROFEN 600 MG PO TABS: 600.0000 mg | ORAL_TABLET | Freq: Four times a day (QID) | ORAL | Status: DC

## 2014-12-11 NOTE — Progress Notes (Signed)
POD # 3  Subjective: Pt reports feeling well, ready for discharge/ Pain controlled with Motrin and Percocet Tolerating po/Voiding without problems/ No n/v/ Flatus present Activity: ad lib Bleeding is light Newborn info:  Information for the patient's newborn:  [name], [name] [[id number]]  female  / Circumcision: done/ Feeding: breast and bottle  Objective: VS: VS:  Filed Vitals:   12/09/14 1833 12/10/14 0558 12/10/14 1739 12/11/14 0606  BP: 108/75 122/68 130/67 132/66  Pulse: 67 76 79 66  Temp: 98.4 F (36.9 C) 98.6 F (37 C) 97.4 F (36.3 C) 98 F (36.7 C)  TempSrc: Axillary Oral Oral Oral  Resp: Height:      Weight:      SpO2:        I&O: Intake/Output      09/21 0701 - 09/22 0700 09/22 0701 - 09/23 0700   Urine (mL/kg/hr)     Total Output       Net              LABS:  Recent Labs  12/09/14 0520  WBC 13.7*  HGB 10.7*  PLT 210   Blood type: --/--/A POS (09/16 1605) Rubella: Immune (02/29 0000)           Physical Exam:  General: alert, cooperative and no distress CV: Regular rate and rhythm Resp: CTA bilaterally Abdomen: soft, nontender, normal bowel sounds Incision: Covered with Tegaderm and honeycomb dressing; no significant drainage, edema, bruising, or erythema; well approximated with suture Uterine Fundus: firm, below umbilicus, nontender Lochia: minimal Ext: extremities normal, atraumatic, no cyanosis or edema and Homans sign is negative, no sign of DVT   Assessment: POD # 3/ G9P2071/ S/P C/Section d/t repeat  Mild anemia Doing well and stable for discharge home  Plan: Discharge home RX's: Ibuprofen  po Q 6 hrs prn pain #30 Refill x 1 Percocet 5/325 1 - 2 tabs po every 4 hrs prn pain #30 Refill x 0 Follow up in 6 wks for postpartum check at [facility]yn booklet given    Signed: Lawernce Pitts, MSN, CNM 12/11/2014, 11:33 AM

## 2014-12-11 NOTE — Discharge Summary (Signed)
DISCHARGE SUMMARY:  Patient ID: Caroline Friedman MRN: [id number] DOB/AGE: 12/15/1975 39 y.o.  Admit date: 12/08/2014 Admission Diagnoses: 39.[redacted] weeks gestation, previous CS   Discharge date: 12/11/2014 Discharge Diagnoses: S/P C/S on 12/08/14, Mild anemia        Prenatal history: [id number]   EDC: 12/13/2014, by Last Menstrual Period and early sono Prenatal care at [facility] since [redacted] wks gestation. Primary provider: Dr. Ernestina Penna Prenatal course complicated by - PAI/ MTHFR heterozygote/ RPL- successful preg last yr on heparin and Lovenox, doses repeated this preg - AMA, nl fetal testing in past month, Nl NT, nl Informaseq - Arcuate Uterus - Prior c/s for breech/ macrosomia  Prenatal labs: ABO, Rh: --/--/A POS (09/16 1605)  Antibody: NEG (09/16 1605) Rubella:   Immune RPR: Non Reactive (09/16 1605)  HBsAg: Negative (02/29 0000)  HIV: Non-reactive (02/29 0000)  GBS:   Neg GTT: nml  Medical / Surgical History :  Past medical history:  Past Medical History  Diagnosis Date  . Blood dyscrasia     thrombophillia  . Postpartum care following cesarean delivery (1/26) 04/15/2013  . Status post repeat low transverse cesarean section (9/19) 12/08/2014  . Former smoker   . Hx of breast augmentation   . Anemia   . Hx of appendectomy     Past surgical history:  Past Surgical History  Procedure Laterality Date  . Appendectomy    . Breast surgery      lift and augmentation  . Cesarean section N/A 04/15/2013    Procedure: Primary CESAREAN SECTION;  Surgeon: Tresa Endo A. Ernestina Penna, MD;  Location: [facility] OR[facility];  Service: Obstetrics;  Laterality: N/A;  EDD: 04/20/13  . Cesarean section N/A 12/08/2014    Procedure: Repeat CESAREAN SECTION;  Surgeon: Noland Fordyce, MD;  Location: [facility] ORS;  Service: Obstetrics;  Laterality: N/A;  EDD: 12/13/14     Medications on Admission: Prescriptions prior to admission  Medication Sig Dispense Refill Last Dose  . folic acid (FOLVITE) 1 MG tablet  Take 4 mg by mouth daily.   Past Week at Unknown time  . heparin 14782 UNIT/ML injection Inject 10,000 Units into the skin every 12 (twelve) hours.   12/07/2014 at 0830  . Prenat w/o A-FeCbGl-DSS-FA-DHA (CITRANATAL ASSURE) 300 MG MISC Take 1 tablet by mouth daily.    Past Week at Unknown time  . valACYclovir (VALTREX) 500 MG tablet Take 500 mg by mouth daily.    12/07/2014 at Unknown time    Allergies: Review of patient's allergies indicates no known allergies.   Intrapartum Course:  Admitted for scheduled repeat CS under regional anesthesia   Postpartum Course: Complicated by mild anemia, d/c'd home on POD #3  Physical Exam:   VSS: Blood pressure 132/66, pulse 66, temperature 98 F (36.7 C), temperature source Oral, resp. rate 18, height  (1.6 m), weight 74.39 kg (164 lb), last menstrual period 03/08/2014, SpO2 99 %, unknown if currently breastfeeding.  LABS:  Recent Labs  12/09/14 0520  WBC 13.7*  HGB 10.7*  PLT 210    General: alert and oriented x3 Heart: RRR Lungs: CTA bilaterally GI: soft, non-tender, non-distended, BS x4 Lochia: small Uterus: firm below umbilicus Incision: well approximated with suture; honeycomb dressing-no significant erythema, drainage, or edema Extremities: No edema, Homans neg   Newborn Data Live born female  Birth Weight: 6 lb 10.9 oz (3030 g) APGAR: 8, 9  See operative report for further details  Home with mother.  Discharge Instructions:  Wound Care: keep  clean and dry / remove honeycomb POD 6 Postpartum Instructions: [facility] discharge booklet - instructions reviewed Medications:    Medication List    STOP taking these medications        folic acid 1 MG tablet  Commonly known as:  FOLVITE     heparin 16109 UNIT/ML injection     valACYclovir 500 MG tablet  Commonly known as:  VALTREX      TAKE these medications        CITRANATAL ASSURE 300 MG Misc  Take 1 tablet by mouth daily.     docusate sodium 100 MG capsule   Commonly known as:  COLACE  Take 1 capsule (100 mg total) by mouth daily.     ibuprofen 600 MG tablet  Commonly known as:  ADVIL,MOTRIN  Take 1 tablet (600 mg total) by mouth every 6 (six) hours.     magnesium oxide 400 (241.3 MG) MG tablet  Commonly known as:  MAG-OX  Take 0.5 tablets (200 mg total) by mouth daily.     oxyCODONE-acetaminophen 5-325 MG per tablet  Commonly known as:  PERCOCET/ROXICET  Take 1-2 tablets by mouth every 4 (four) hours as needed (for pain scale greater than 7).            Follow-up Information    Follow up with [facility] A[facility] MD. Schedule an appointment as soon as possible for a visit in 6 weeks.   Specialty:  Obstetrics and Gynecology   Contact information:   [address] [place] [address] [phone]         Signed: Donette Larry, Dorris Carnes MSN, CNM 12/11/2014, 11:35 AM

## 2014-12-11 NOTE — Lactation Note (Signed)
This note was copied from the chart of Caroline Chrissy Ealey. Lactation Consultation Note  Patient Name: Caroline Friedman [id number] Date: 12/11/2014  per mom due to my breast surgery - implants and also due to not having a great milk supply  With 1st baby plan to breast and bottle.  LC assessed breast tissue due to sore nipples with moms permission , both nipples have  Positional strip scabs , and pink around scabs. Breast are warm bilaterally and fuller,  Per mom when I pumped both breast was able to get more milk . LC reassured mom for 3 days PP is a good  Sign. LC recommended due to baby also receiving bottles , if the baby is to fussy to latch - give him an appetizer  Of EBM and then latch , baby will be calmer latching. Baby recently was fed and is sound asleep at present. Sore nipple and engorgement prevention and tx reviewed. Mom already has been using comfort gels , hand pump,  And the DEBP . LC also recommended to use breast shells when the comfort gels are in the refrigerator cooling. Due to mom s breast surgery the base of the areola is semi compressible and the shells would help. Per mom has a DEBP at home .  LC also recommended frequent weight checks and consider calling LC office or the [facility] at [facility] .  Mother informed of post-discharge support and given phone number to the lactation department, including services  for phone call assistance; out-patient appointments; and breastfeeding support group. List of other breastfeeding resources  in the community given in the handout. Encouraged mother to call for problems or concerns related to breastfeeding.    Maternal Data    Feeding Feeding Type: Bottle Fed - Formula  LATCH Score/Interventions                      Lactation Tools Discussed/Used     Consult Status      [name] 12/11/2014, 11:22 AM

## 2015-08-25 ENCOUNTER — Other Ambulatory Visit: Payer: Self-pay | Admitting: General Surgery

## 2015-08-25 DIAGNOSIS — R922 Inconclusive mammogram: Secondary | ICD-10-CM

## 2015-09-02 ENCOUNTER — Ambulatory Visit
Admission: RE | Admit: 2015-09-02 | Discharge: 2015-09-02 | Disposition: A | Discharge status: Home / Self Care | Payer: BC Managed Care – PPO | Source: Ambulatory Visit | Attending: General Surgery | Admitting: General Surgery

## 2015-09-02 DIAGNOSIS — R922 Inconclusive mammogram: Secondary | ICD-10-CM

## 2015-09-02 MED ORDER — GADOBENATE DIMEGLUMINE 529 MG/ML IV SOLN
11.0000 mL | Freq: Once | INTRAVENOUS | Status: AC | PRN
Start: 2015-09-02 — End: 2015-09-02
  Administered 2015-09-02: 11 mL via INTRAVENOUS

## 2015-09-04 NOTE — Progress Notes (Signed)
Quick Note:  Please let patietn know MRI looks good. ______

## 2015-09-29 DIAGNOSIS — Z803 Family history of malignant neoplasm of breast: Secondary | ICD-10-CM | POA: Insufficient documentation

## 2015-10-13 DIAGNOSIS — Z720 Tobacco use: Secondary | ICD-10-CM | POA: Insufficient documentation

## 2015-10-13 DIAGNOSIS — F1090 Alcohol use, unspecified, uncomplicated: Secondary | ICD-10-CM | POA: Insufficient documentation

## 2015-10-13 DIAGNOSIS — Z7289 Other problems related to lifestyle: Secondary | ICD-10-CM | POA: Insufficient documentation

## 2015-10-13 DIAGNOSIS — Z789 Other specified health status: Secondary | ICD-10-CM

## 2016-04-20 ENCOUNTER — Ambulatory Visit: Payer: BC Managed Care – PPO | Admitting: Neurology

## 2016-04-20 ENCOUNTER — Other Ambulatory Visit: Payer: Self-pay | Admitting: *Deleted

## 2016-04-20 DIAGNOSIS — R202 Paresthesia of skin: Secondary | ICD-10-CM

## 2016-05-03 ENCOUNTER — Encounter: Payer: BC Managed Care – PPO | Admitting: Neurology

## 2016-06-21 ENCOUNTER — Encounter: Payer: BC Managed Care – PPO | Admitting: Neurology

## 2016-08-29 ENCOUNTER — Other Ambulatory Visit: Payer: Self-pay | Admitting: General Surgery

## 2016-08-29 DIAGNOSIS — Z803 Family history of malignant neoplasm of breast: Secondary | ICD-10-CM

## 2016-09-13 ENCOUNTER — Encounter: Payer: BC Managed Care – PPO | Admitting: Neurology

## 2016-09-25 ENCOUNTER — Ambulatory Visit
Admission: RE | Admit: 2016-09-25 | Discharge: 2016-09-25 | Disposition: A | Discharge status: Home / Self Care | Payer: BC Managed Care – PPO | Source: Ambulatory Visit | Attending: General Surgery | Admitting: General Surgery

## 2016-09-25 DIAGNOSIS — Z803 Family history of malignant neoplasm of breast: Secondary | ICD-10-CM

## 2016-09-25 MED ORDER — GADOBENATE DIMEGLUMINE 529 MG/ML IV SOLN
10.0000 mL | Freq: Once | INTRAVENOUS | Status: AC | PRN
  Administered 2016-09-25: 10 mL via INTRAVENOUS

## 2016-10-18 ENCOUNTER — Encounter: Payer: BC Managed Care – PPO | Admitting: Neurology

## 2016-11-03 ENCOUNTER — Ambulatory Visit (INDEPENDENT_AMBULATORY_CARE_PROVIDER_SITE_OTHER): Payer: BC Managed Care – PPO | Admitting: Neurology

## 2016-11-03 DIAGNOSIS — G5603 Carpal tunnel syndrome, bilateral upper limbs: Secondary | ICD-10-CM

## 2016-11-03 DIAGNOSIS — R202 Paresthesia of skin: Secondary | ICD-10-CM | POA: Diagnosis not present

## 2016-11-03 NOTE — Procedures (Signed)
[facility]  [address], [address]  [place], [place] [address] Tel: [phone] Fax:  [phone] Test Date:  11/03/2016  Patient: Caroline Friedman DOB: 05/09/1975 Physician: Nita Sickleonika Legend Pecore, DO  Sex: Female Height: 5\' 3"  Ref Phys: Georgann HousekeeperKarrar Husain, MD  ID#: [id number] Temp: 33.7C Technician:    Patient Complaints: This is a 41 year-old female referred for evaluation bilateral hand paresthesias.  NCV & EMG Findings: Extensive electrodiagnostic testing of the right upper extremity and additional studies of the left shows:  1. Bilateral median sensory responses show prolonged latency (R3.7, L3.5 ms) and normal amplitude. Bilateral ulnar sensory responses are within normal limits. 2. 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 Further, the right median motor response shows prolonged latency (4.3 ms). 3. There is no evidence of active or chronic motor axon loss changes affecting any of the tested muscles. Motor unit configuration and recruitment pattern is within normal limits.  Impression: 1. Right median neuropathy at or distal to the wrist, consistent with a clinical diagnosis of carpal tunnel syndrome; moderate in degree electrically. 2. Left median neuropathy at or distal to the wrist, consistent with a clinical diagnosis of carpal tunnel syndrome; mild in degree electrically. 3. 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    ___________________________ Nita Sickleonika Redford Behrle, DO    Nerve Conduction Studies Anti Sensory Summary Table   Site NR Peak (ms) Norm Peak (ms) P-T Amp (V) Norm P-T Amp  Left Median Anti Sensory (2nd Digit)  33.7C  Wrist    3.5 <3.4 39.4 >20  Right Median Anti Sensory (2nd Digit)  33.7C  Wrist    3.7 <3.4 35.8 >20  Left Ulnar Anti Sensory (5th Digit)  33.7C  Wrist    2.6  <3.1 33.5 >12  Right Ulnar Anti Sensory (5th Digit)  33.7C  Wrist    2.4 <3.1 35.8 >12   Motor Summary Table   Site NR Onset (ms) Norm Onset (ms) O-P Amp (mV) Norm O-P Amp Site1 Site2 Delta-0 (ms) Dist (cm) Vel (m/s) Norm Vel (m/s)  Left Median Motor (Abd Poll Brev)  33.7C  Wrist    3.4 <3.9 5.4 >6 Elbow Wrist 4.7 0.0  >50  Elbow    8.1  7.3  Ulnar-wrist crossover Elbow 4.1 0.0    Ulnar-wrist crossover    4.0  7.1         Right Median Motor (Abd Poll Brev)  33.7C  Wrist    4.3 <3.9 4.6 >6 Elbow Wrist 3.7 0.0  >50  Elbow    8.0  8.2  Ulnar-wrist crossover Elbow 4.2 0.0    Ulnar-wrist crossover    3.8  8.0         Left Ulnar Motor (Abd Dig Minimi)  33.7C  Wrist    2.3 <3.1 10.0 >7 B Elbow Wrist 3.0 21.0 70 >50  B Elbow    5.3  8.4  A Elbow B Elbow 1.6 10.0 62 >50  A Elbow    6.9  7.8         Right Ulnar Motor (Abd Dig Minimi)  33.7C  Wrist    2.1 <3.1 8.7 >7 B Elbow Wrist 3.0 20.0 67 >50  B Elbow    5.1  7.3  A Elbow B Elbow 1.7 10.0 59 >50  A Elbow    6.8  7.2  EMG   Side Muscle Ins Act Fibs Psw Fasc Number Recrt Dur Dur. Amp Amp. Poly Poly. Comment  Right 1stDorInt Nml Nml Nml Nml Nml Nml Nml Nml Nml Nml Nml Nml N/A  Right Abd Poll Brev Nml Nml Nml Nml Nml Nml Nml Nml Nml Nml Nml Nml N/A  Right PronatorTeres Nml Nml Nml Nml Nml Nml Nml Nml Nml Nml Nml Nml N/A  Right Biceps Nml Nml Nml Nml Nml Nml Nml Nml Nml Nml Nml Nml N/A  Right Triceps Nml Nml Nml Nml Nml Nml Nml Nml Nml Nml Nml Nml N/A  Right Deltoid Nml Nml Nml Nml Nml Nml Nml Nml Nml Nml Nml Nml N/A  Left 1stDorInt Nml Nml Nml Nml Nml Nml Nml Nml Nml Nml Nml Nml N/A  Left Abd Poll Brev Nml Nml Nml Nml Nml Nml Nml Nml Nml Nml Nml Nml N/A  Left PronatorTeres Nml Nml Nml Nml Nml Nml Nml Nml Nml Nml Nml Nml N/A  Left Biceps Nml Nml Nml Nml Nml Nml Nml Nml Nml Nml Nml Nml N/A  Left Triceps Nml Nml Nml Nml Nml Nml Nml Nml Nml Nml Nml Nml N/A  Left Deltoid Nml Nml Nml Nml Nml Nml Nml Nml Nml Nml Nml Nml N/A       Waveforms:

## 2017-01-16 ENCOUNTER — Ambulatory Visit
Admission: RE | Admit: 2017-01-16 | Discharge: 2017-01-16 | Disposition: A | Discharge status: Home / Self Care | Payer: BC Managed Care – PPO | Source: Ambulatory Visit | Attending: Nurse Practitioner | Admitting: Nurse Practitioner

## 2017-01-16 ENCOUNTER — Other Ambulatory Visit: Payer: Self-pay | Admitting: Nurse Practitioner

## 2017-01-16 DIAGNOSIS — J40 Bronchitis, not specified as acute or chronic: Secondary | ICD-10-CM

## 2017-03-06 ENCOUNTER — Other Ambulatory Visit: Payer: Self-pay | Admitting: Internal Medicine

## 2017-03-06 ENCOUNTER — Ambulatory Visit
Admission: RE | Admit: 2017-03-06 | Discharge: 2017-03-06 | Disposition: A | Discharge status: Home / Self Care | Payer: BC Managed Care – PPO | Source: Ambulatory Visit | Attending: Internal Medicine | Admitting: Internal Medicine

## 2017-03-06 DIAGNOSIS — F17200 Nicotine dependence, unspecified, uncomplicated: Secondary | ICD-10-CM

## 2017-03-06 DIAGNOSIS — J189 Pneumonia, unspecified organism: Secondary | ICD-10-CM

## 2017-03-06 DIAGNOSIS — J181 Lobar pneumonia, unspecified organism: Principal | ICD-10-CM

## 2017-03-10 ENCOUNTER — Other Ambulatory Visit: Payer: BC Managed Care – PPO

## 2017-03-10 ENCOUNTER — Ambulatory Visit
Admission: RE | Admit: 2017-03-10 | Discharge: 2017-03-10 | Disposition: A | Discharge status: Home / Self Care | Payer: BC Managed Care – PPO | Source: Ambulatory Visit | Attending: Internal Medicine | Admitting: Internal Medicine

## 2017-03-10 DIAGNOSIS — F17200 Nicotine dependence, unspecified, uncomplicated: Secondary | ICD-10-CM

## 2017-03-10 DIAGNOSIS — J189 Pneumonia, unspecified organism: Secondary | ICD-10-CM

## 2017-03-10 MED ORDER — IOPAMIDOL (ISOVUE-300) INJECTION 61%
100.0000 mL | Freq: Once | INTRAVENOUS | Status: AC | PRN
  Administered 2017-03-10: 75 mL via INTRAVENOUS

## 2017-03-15 ENCOUNTER — Other Ambulatory Visit: Payer: BC Managed Care – PPO

## 2017-03-31 ENCOUNTER — Encounter: Payer: Self-pay | Admitting: Pulmonary Disease

## 2017-03-31 ENCOUNTER — Ambulatory Visit: Payer: BC Managed Care – PPO | Admitting: Pulmonary Disease

## 2017-03-31 ENCOUNTER — Ambulatory Visit (INDEPENDENT_AMBULATORY_CARE_PROVIDER_SITE_OTHER)
Admission: RE | Admit: 2017-03-31 | Discharge: 2017-03-31 | Disposition: A | Discharge status: Home / Self Care | Payer: BC Managed Care – PPO | Source: Ambulatory Visit | Attending: Pulmonary Disease | Admitting: Pulmonary Disease

## 2017-03-31 VITALS — BP 126/62 | HR 91 | Ht 63.0 in | Wt 108.6 lb

## 2017-03-31 DIAGNOSIS — J181 Lobar pneumonia, unspecified organism: Secondary | ICD-10-CM | POA: Diagnosis not present

## 2017-03-31 NOTE — Progress Notes (Addendum)
Caroline Friedman    [id number]    [birth year]  Primary Care Physician:Husain, Jerelyn Scott, MD  Referring Physician: Georgann Housekeeper, MD [address] [place], [place] [address]  Chief complaint: Consult for recurrent pneumonia  HPI: 42 year old ex-smoker failure, thrombophilia, ADD.  She was treated for a left lower lobe pneumonia in October 2018.  She was reevaluated in December for strep throat.  A follow-up chest x-ray at that time showed left upper lobe infiltrate with a CT scan showing groundglass and nodular opacities in the lingula.  She has been referred here for further follow-up and concern for atypical mycobacterial infection  She feels that she is recovered well from her lung infection.  Has some sinus congestion with nonproductive cough.  Denies any fevers, chills She has history of frequent miscarriages.  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  She is on aspirin for this.  There is no history of DVT, PE.  Pets: Has dog, cat.  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  However there is no mold at home Smoking history: 30-45-pack-year smoking history.  Quit in 2011 Travel History: Not significant  Outpatient Encounter Medications as of 03/31/2017  Medication Sig  . ALPRAZolam (XANAX) 1 MG tablet Take 1 mg by mouth 2 (two) times daily as needed for anxiety.  Marland Kitchen aspirin 81 MG chewable tablet Chew 81 mg by mouth daily.  Marland Kitchen docusate sodium (COLACE) 100 MG capsule Take 1 capsule (100 mg total) by mouth daily.  . folic acid (FOLVITE) 1 MG tablet Take 1 mg by mouth daily.  Marland Kitchen ibuprofen (ADVIL,MOTRIN) 600 MG tablet Take 1 tablet (600 mg total) by mouth every 6 (six) hours.  . valACYclovir (VALTREX) 500 MG tablet Take 500 mg by mouth as needed.  . [DISCONTINUED] magnesium oxide (MAG-OX) 400 (241.3 MG) MG tablet Take 0.5 tablets (200 mg total) by mouth  daily.  . [DISCONTINUED] oxyCODONE-acetaminophen (PERCOCET/ROXICET) 5-325 MG per tablet Take 1-2 tablets by mouth every 4 (four) hours as needed (for pain scale greater than 7).  . [DISCONTINUED] Prenat w/o A-FeCbGl-DSS-FA-DHA (CITRANATAL ASSURE) 300 MG MISC Take 1 tablet by mouth daily.    No facility-administered encounter medications on file as of 03/31/2017.     Allergies as of 03/31/2017  . (No Known Allergies)    Past Medical History:  Diagnosis Date  . Anemia   . Blood dyscrasia    thrombophillia  . Former smoker   . Hx of appendectomy   . Hx of breast augmentation   . Postpartum care following cesarean delivery (1/26) 04/15/2013  . Status post repeat low transverse cesarean section (9/19) 12/08/2014    Past Surgical History:  Procedure Laterality Date  . APPENDECTOMY    . BREAST SURGERY     lift and augmentation  . CESAREAN SECTION N/A 04/15/2013   Procedure: Primary CESAREAN SECTION;  Surgeon: Tresa Endo A. Ernestina Penna, MD;  Location: [facility];  Service: Obstetrics;  Laterality: N/A;  EDD: 04/20/13  . CESAREAN SECTION N/A 12/08/2014   Procedure: Repeat CESAREAN SECTION;  Surgeon: Noland Fordyce, MD;  Location: [facility];  Service: Obstetrics;  Laterality: N/A;  EDD: 12/13/14    Family History  Problem Relation Age of Onset  . Clotting disorder Father   . Breast cancer Mother   . Breast cancer Maternal Grandmother     Social History   Socioeconomic  History  . Marital status: Married    Spouse name: Not on file  . Number of children: Not on file  . Years of education: Not on file  . Highest education level: Not on file  Social Needs  . Financial resource strain: Not on file  . Food insecurity - worry: Not on file  . Food insecurity - inability: Not on file  . Transportation needs - medical: Not on file  . Transportation needs - non-medical: Not on file  Occupational History  . Not on file  Tobacco Use  . Smoking status: Former Smoker    Packs/day: 1.50    Years: 27.00      Pack years: 40.50    Types: Cigarettes    Last attempt to quit: 03/21/2010    Years since quitting: 7.0  . Smokeless tobacco: Current User  Substance and Sexual Activity  . Alcohol use: Yes    Comment: Daily  . Drug use: No  . Sexual activity: Yes  Other Topics Concern  . Not on file  Social History Narrative  . Not on file    Review of systems: Review of Systems  Constitutional: Negative for fever and chills.  HENT: Negative.   Eyes: Negative for blurred vision.  Respiratory: as per HPI  Cardiovascular: Negative for chest pain and palpitations.  Gastrointestinal: Negative for vomiting, diarrhea, blood per rectum. Genitourinary: Negative for dysuria, urgency, frequency and hematuria.  Musculoskeletal: Negative for myalgias, back pain and joint pain.  Skin: Negative for itching and rash.  Neurological: Negative for dizziness, tremors, focal weakness, seizures and loss of consciousness.  Endo/Heme/Allergies: Negative for environmental allergies.  Psychiatric/Behavioral: Negative for depression, suicidal ideas and hallucinations.  All other systems reviewed and are negative.  Physical Exam: Blood pressure 126/62, pulse 91, height 5\' 3"  (1.6 m), weight 108 lb 9.6 oz (49.3 kg), SpO2 100 %, unknown if currently breastfeeding. Gen:      No acute distress HEENT:  EOMI, sclera anicteric Neck:     No masses; no thyromegaly Lungs:    Clear to auscultation bilaterally; normal respiratory effort CV:         Regular rate and rhythm; no murmurs Abd:      + bowel sounds; soft, non-tender; no palpable masses, no distension Ext:    No edema; adequate peripheral perfusion Skin:      Warm and dry; no rash Neuro: alert and oriented x 3 Psych: normal mood and affect  Data Reviewed: Chest x-ray 01/16/17-left lower lobe opacity Chest x-ray 03/06/17-left upper lobe consolidation CT chest 03/10/17- groundglass opacities with nodular consolidation in the left lingula I have reviewed the  images personally.  Assessment:  Consult for evaluation of recurrent pneumonia, concern for atypical mycobacterial infection Suspicion for mycobacterial infection is low as she does not have any underlying chronic lung disease.  She is unable to bring up sputum for AFB cultures.  As she is asymptomatic we will defer bronchoscope for now I will get a chest x-ray to make sure the infiltrate is improving.    Suspicion for malignancy is low given the radiographic finding.  However she is at increased risk with a smoking history. She will need a follow-up CT without contrast in 3 months  Plan/Recommendations: - CXR today - Follow up CT without contrast in 3 months.   Chilton GreathousePraveen Birdell Frasier MD [facility] Pager [phone] 03/31/2017, 10:37 AM  CC: Georgann HousekeeperHusain, Karrar, MD  Addendum: Received note from urology, Dr. Berneice HeinrichManny 07/10/2017 Seen  for small left renal stone 3 mm seen on CT chest Advised conservative management.  Follow-up in 1 year with KUB and renal ultrasound.  Chilton Greathouse MD [facility] 08/03/2017, 8:54 AM

## 2017-03-31 NOTE — Patient Instructions (Signed)
We will get a chest x-ray today to reevaluate the left lung pneumonia We will schedule you for a CT chest without contrast and 3 months. We will see you back in clinic after CT of the chest  Please call us sooner if there is any change in his symptoms.

## 2017-04-05 ENCOUNTER — Telehealth: Payer: Self-pay | Admitting: Pulmonary Disease

## 2017-04-05 NOTE — Telephone Encounter (Signed)
Lungs are normal and the pneumonia has cleared up. Thanks

## 2017-04-05 NOTE — Telephone Encounter (Signed)
Pt requesting 1/11 cxr results.   PM please advise.  Thanks!

## 2017-04-05 NOTE — Telephone Encounter (Signed)
Called pt letting her know the results of her cxr. Pt expressed understanding. Nothing further needed.

## 2017-06-30 ENCOUNTER — Ambulatory Visit (INDEPENDENT_AMBULATORY_CARE_PROVIDER_SITE_OTHER)
Admission: RE | Admit: 2017-06-30 | Discharge: 2017-06-30 | Disposition: A | Discharge status: Home / Self Care | Payer: BC Managed Care – PPO | Source: Ambulatory Visit | Attending: Pulmonary Disease | Admitting: Pulmonary Disease

## 2017-06-30 DIAGNOSIS — J181 Lobar pneumonia, unspecified organism: Secondary | ICD-10-CM | POA: Diagnosis not present

## 2017-07-03 ENCOUNTER — Telehealth: Payer: Self-pay | Admitting: Pulmonary Disease

## 2017-07-03 ENCOUNTER — Ambulatory Visit: Payer: BC Managed Care – PPO | Admitting: Pulmonary Disease

## 2017-07-03 NOTE — Telephone Encounter (Signed)
Notes recorded by Chilton GreathouseMannam, Praveen, MD on 07/03/2017 at 12:37 PM EDT Pt was a no show today. Please let her know CT shows resolution of pneumonia. There are no other abnormalities except for a 3 mm kidney stone. Follow up as needed  Spoke with patient. She is aware of results. She has asked that I fax a copy of the CT report to Dr. Venita SheffieldHusain's office. Report has been faxed.   Nothing else needed at time of call.

## 2017-07-12 ENCOUNTER — Ambulatory Visit: Payer: BC Managed Care – PPO | Admitting: Pulmonary Disease

## 2017-11-22 ENCOUNTER — Other Ambulatory Visit: Payer: Self-pay | Admitting: General Surgery

## 2017-11-23 ENCOUNTER — Other Ambulatory Visit: Payer: Self-pay | Admitting: General Surgery

## 2017-11-23 DIAGNOSIS — Z803 Family history of malignant neoplasm of breast: Secondary | ICD-10-CM

## 2017-12-31 ENCOUNTER — Other Ambulatory Visit: Payer: BC Managed Care – PPO

## 2017-12-31 ENCOUNTER — Ambulatory Visit
Admission: RE | Admit: 2017-12-31 | Discharge: 2017-12-31 | Disposition: A | Discharge status: Home / Self Care | Payer: BC Managed Care – PPO | Source: Ambulatory Visit | Attending: General Surgery | Admitting: General Surgery

## 2017-12-31 DIAGNOSIS — Z803 Family history of malignant neoplasm of breast: Secondary | ICD-10-CM

## 2017-12-31 MED ORDER — GADOBENATE DIMEGLUMINE 529 MG/ML IV SOLN
9.0000 mL | Freq: Once | INTRAVENOUS | Status: AC | PRN
  Administered 2017-12-31: 9 mL via INTRAVENOUS

## 2018-01-02 NOTE — Progress Notes (Signed)
Please let patient know MRI is normal

## 2018-02-19 ENCOUNTER — Encounter: Payer: Self-pay | Admitting: Podiatry

## 2018-02-19 ENCOUNTER — Ambulatory Visit: Payer: BC Managed Care – PPO | Admitting: Podiatry

## 2018-02-19 ENCOUNTER — Ambulatory Visit (INDEPENDENT_AMBULATORY_CARE_PROVIDER_SITE_OTHER): Payer: BC Managed Care – PPO

## 2018-02-19 ENCOUNTER — Other Ambulatory Visit: Payer: Self-pay | Admitting: Podiatry

## 2018-02-19 VITALS — BP 116/85 | HR 75 | Resp 16

## 2018-02-19 DIAGNOSIS — F909 Attention-deficit hyperactivity disorder, unspecified type: Secondary | ICD-10-CM | POA: Insufficient documentation

## 2018-02-19 DIAGNOSIS — M2012 Hallux valgus (acquired), left foot: Secondary | ICD-10-CM

## 2018-02-19 DIAGNOSIS — F419 Anxiety disorder, unspecified: Secondary | ICD-10-CM | POA: Insufficient documentation

## 2018-02-19 DIAGNOSIS — S92402A Displaced unspecified fracture of left great toe, initial encounter for closed fracture: Secondary | ICD-10-CM | POA: Diagnosis not present

## 2018-02-19 DIAGNOSIS — M79672 Pain in left foot: Secondary | ICD-10-CM

## 2018-02-19 NOTE — Progress Notes (Signed)
   Subjective:    Patient ID: Caroline Friedman, female    DOB: November 11, 1975, 42 y.o.   MRN: [id number]  HPI    Review of Systems  All other systems reviewed and are negative.      Objective:   Physical Exam        Assessment & Plan:

## 2018-02-25 NOTE — Progress Notes (Signed)
Subjective:   Patient ID: Caroline Friedman, female   DOB: 42 y.o.   MRN: [id number]   HPI Patient states she dropped some firewood on her left big toe she does not have a nail there but there was a small hematoma and she is concerned about fracture and does have long-term history of diabetes that is under good control.  Patient does not smoke currently   Review of Systems  All other systems reviewed and are negative.       Objective:  Physical Exam  Constitutional: She appears well-developed and well-nourished.  Cardiovascular: Intact distal pulses.  Pulmonary/Chest: Effort normal.  Musculoskeletal: Normal range of motion.  Neurological: She is alert.  Skin: Skin is warm.  Nursing note and vitals reviewed.   Neurovascular status intact muscle strength is adequate range of motion within normal limits with patient noted to have traumatized left hallux dorsally with a small blood blister that is localized in nature with no proximal edema erythema or drainage noted but some swelling of the digit itself     Assessment:  Probability for fracture of the left hallux secondary to injury with moderate trauma to the proximal portion of the nail which had been removed     Plan:  H&P x-ray reviewed and advised on continued rigid bottom shoe usage soaks and not wearing anything tight on the foot.  Should heal uneventfully  X-ray did indicate probability for fracture of the distal phalanx left but it is nondisplaced and should not be a pathological problem

## 2018-12-11 ENCOUNTER — Other Ambulatory Visit: Payer: Self-pay | Admitting: General Surgery

## 2018-12-11 DIAGNOSIS — Z803 Family history of malignant neoplasm of breast: Secondary | ICD-10-CM

## 2019-01-08 ENCOUNTER — Other Ambulatory Visit: Payer: Self-pay | Admitting: Physician Assistant

## 2019-01-08 ENCOUNTER — Other Ambulatory Visit: Payer: Self-pay | Admitting: General Surgery

## 2019-01-12 IMAGING — DX DG CHEST 2V
2 series · 2 of 2 positions shown · non-contrast
Comparison: CT 03/10/2017.

CLINICAL DATA: Follow-up pneumonia.

EXAM:
CHEST  2 VIEW

[chest pa]
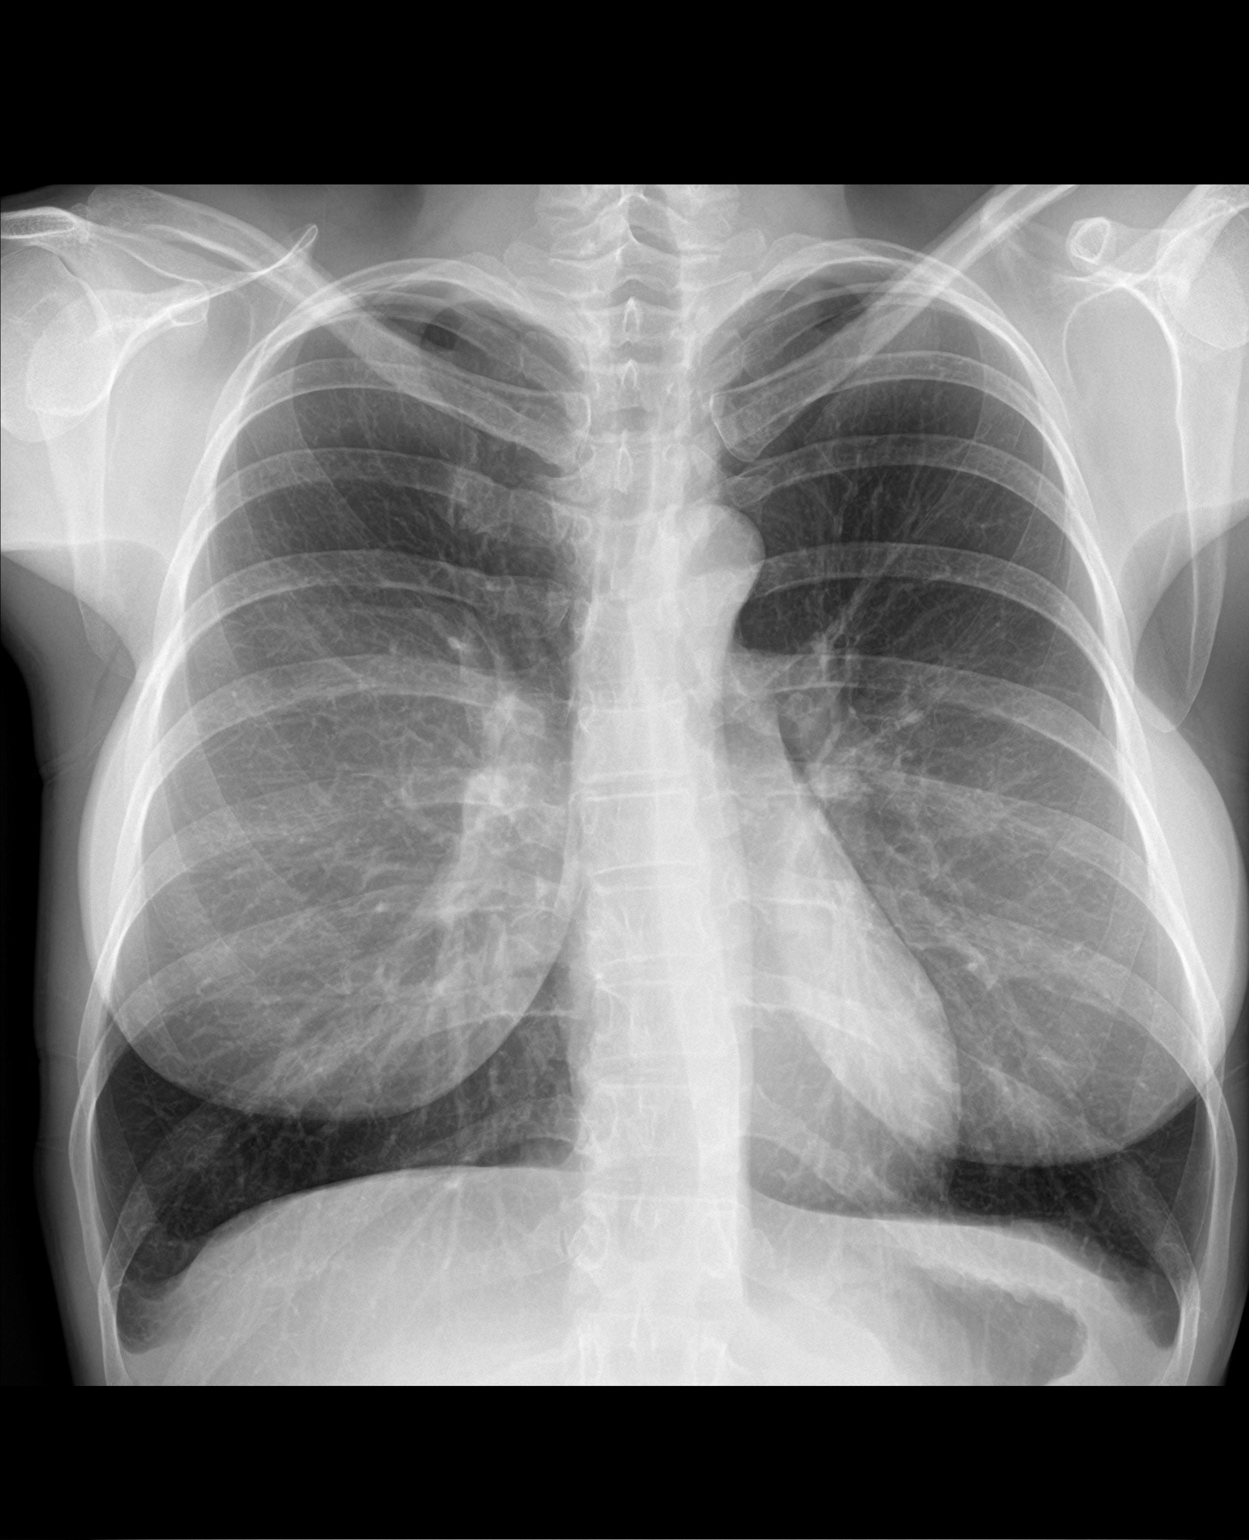

[chest lat]
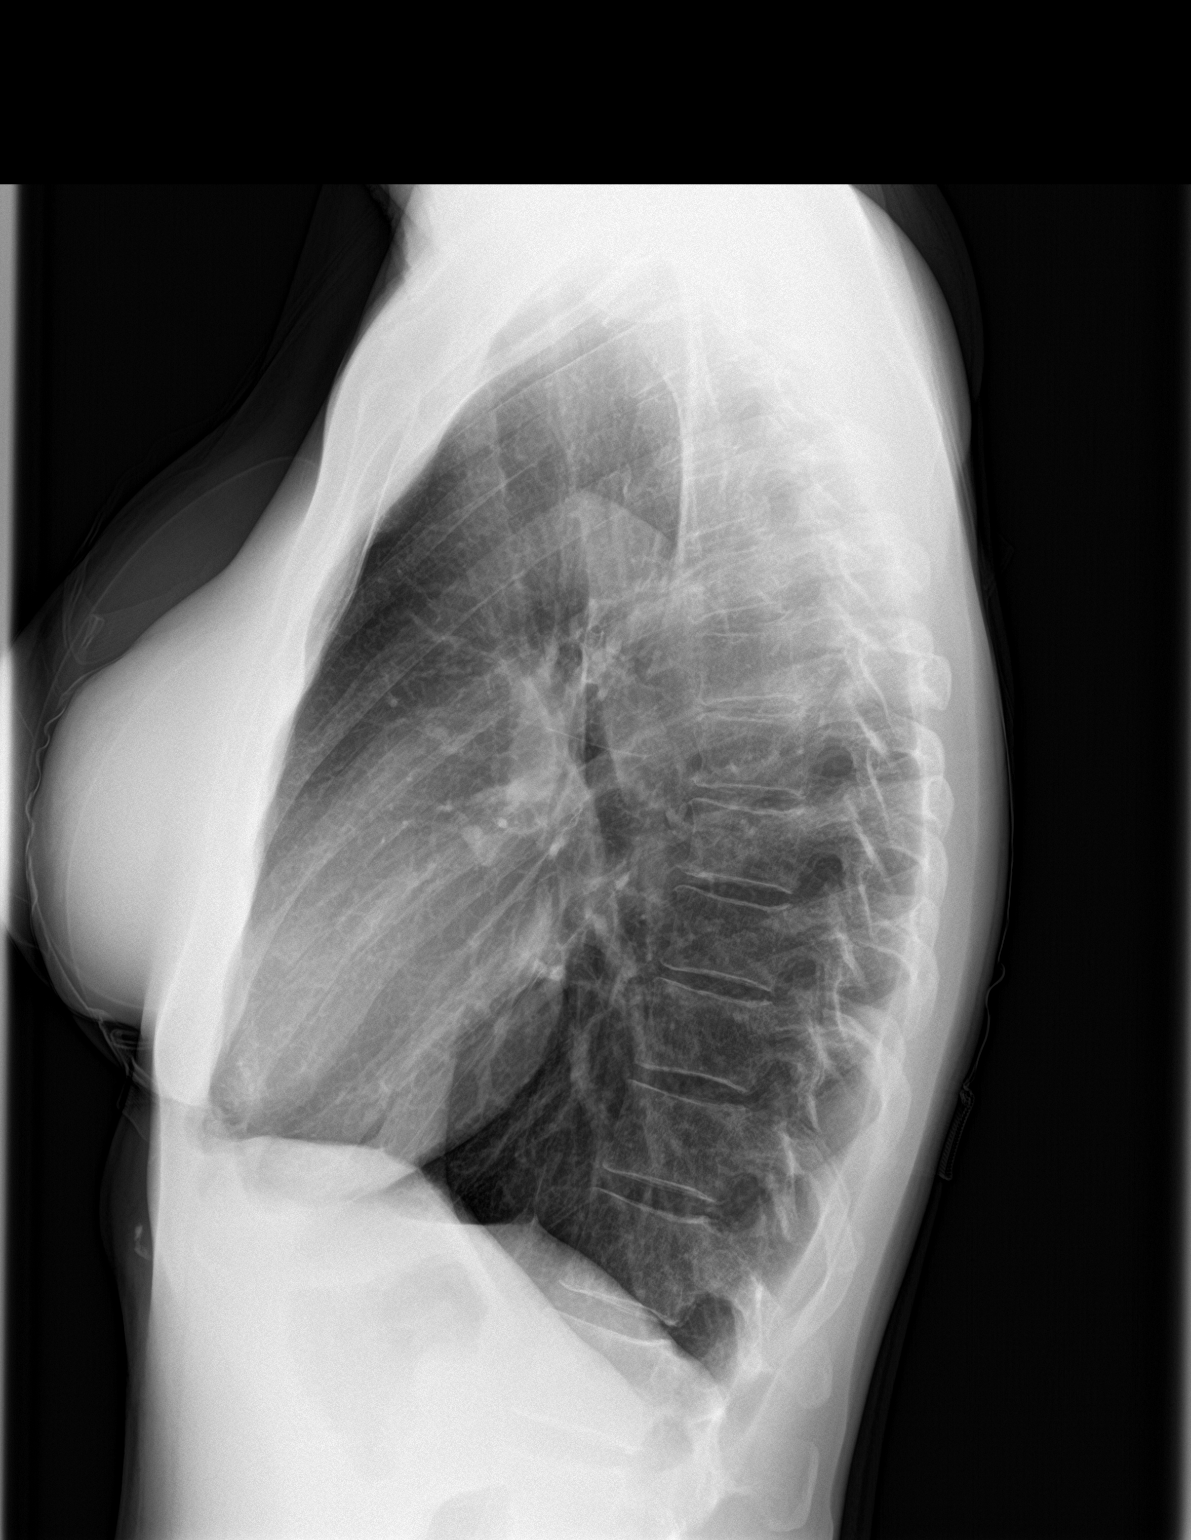

[2 of 2 positions shown; findings below may reference images not displayed]

FINDINGS: Mediastinum and hilar structures normal. Lungs are clear. No pleural
effusion or pneumothorax. Stable bibasilar pleural thickening
consistent with scarring. Heart size normal. Thoracic spine
scoliosis.
IMPRESSION: No acute cardiopulmonary disease. No evidence of infiltrate.
Previously identified lingular infiltrate no longer identified.

## 2019-02-02 ENCOUNTER — Other Ambulatory Visit: Payer: Self-pay

## 2019-02-02 ENCOUNTER — Ambulatory Visit
Admission: RE | Admit: 2019-02-02 | Discharge: 2019-02-02 | Disposition: A | Discharge status: Home / Self Care | Payer: BC Managed Care – PPO | Source: Ambulatory Visit | Attending: General Surgery | Admitting: General Surgery

## 2019-02-02 DIAGNOSIS — Z803 Family history of malignant neoplasm of breast: Secondary | ICD-10-CM

## 2019-02-02 MED ORDER — GADOBUTROL 1 MMOL/ML IV SOLN
5.0000 mL | Freq: Once | INTRAVENOUS | Status: AC | PRN
  Administered 2019-02-02: 5 mL via INTRAVENOUS

## 2019-02-07 ENCOUNTER — Ambulatory Visit
Admission: RE | Admit: 2019-02-07 | Discharge: 2019-02-07 | Disposition: A | Discharge status: Home / Self Care | Payer: BC Managed Care – PPO | Source: Ambulatory Visit | Attending: General Surgery | Admitting: General Surgery

## 2019-02-07 ENCOUNTER — Other Ambulatory Visit: Payer: Self-pay | Admitting: General Surgery

## 2019-02-07 ENCOUNTER — Other Ambulatory Visit: Payer: Self-pay

## 2019-02-07 DIAGNOSIS — Z803 Family history of malignant neoplasm of breast: Secondary | ICD-10-CM

## 2019-02-07 MED ORDER — IOPAMIDOL (ISOVUE-300) INJECTION 61%
75.0000 mL | Freq: Once | INTRAVENOUS | Status: AC | PRN
  Administered 2019-02-07: 75 mL via INTRAVENOUS

## 2019-02-07 NOTE — Progress Notes (Signed)
Please let patient know that it looks like there is an old healing rib fracture and no other significant findings.

## 2019-05-18 ENCOUNTER — Ambulatory Visit: Payer: Self-pay | Attending: Internal Medicine

## 2019-05-18 DIAGNOSIS — Z23 Encounter for immunization: Secondary | ICD-10-CM | POA: Insufficient documentation

## 2019-05-18 NOTE — Progress Notes (Signed)
   Covid-19 Vaccination Clinic  Name:  Caroline Friedman    MRN: [id number] DOB: 31-Jul-1975  05/18/2019  [name] was observed post Covid-19 immunization for 15 minutes without incidence. She was provided with Vaccine Information Sheet and instruction to access the V-Safe system.   [name] was instructed to call 911 with any severe reactions post vaccine: Marland Kitchen Difficulty breathing  . Swelling of your face and throat  . A fast heartbeat  . A bad rash all over your body  . Dizziness and weakness    Immunizations Administered    Name Date Dose VIS Date Route   Pfizer COVID-19 Vaccine 05/18/2019  2:48 PM 0.3 mL 03/01/2019 Intramuscular   Manufacturer: [employer], [employer]   Lot: [id number]   NDC: [address]-6

## 2019-06-08 ENCOUNTER — Ambulatory Visit: Payer: Self-pay | Attending: Internal Medicine

## 2019-06-08 DIAGNOSIS — Z23 Encounter for immunization: Secondary | ICD-10-CM

## 2019-06-08 NOTE — Progress Notes (Signed)
   Covid-19 Vaccination Clinic  Name:  Caroline Friedman    MRN: [id number] DOB: [birth year]  06/08/2019  Caroline Friedman was observed post Covid-19 immunization for 15 minutes without incident. She was provided with Vaccine Information Sheet and instruction to access the V-Safe system.   Caroline Friedman was instructed to call 911 with any severe reactions post vaccine: Marland Kitchen Difficulty breathing  . Swelling of face and throat  . A fast heartbeat  . A bad rash all over body  . Dizziness and weakness   Immunizations Administered    Name Date Dose VIS Date Route   Pfizer COVID-19 Vaccine 06/08/2019  2:02 PM 0.3 mL 03/01/2019 Intramuscular   Manufacturer: [employer], [employer]   Lot: [id number]   NDC: [address]-8

## 2020-01-15 ENCOUNTER — Other Ambulatory Visit: Payer: Self-pay | Admitting: General Surgery

## 2020-01-15 DIAGNOSIS — Z803 Family history of malignant neoplasm of breast: Secondary | ICD-10-CM

## 2020-04-29 ENCOUNTER — Ambulatory Visit
Admission: RE | Admit: 2020-04-29 | Discharge: 2020-04-29 | Disposition: A | Discharge status: Home / Self Care | Payer: BC Managed Care – PPO | Source: Ambulatory Visit | Attending: General Surgery | Admitting: General Surgery

## 2020-04-29 ENCOUNTER — Other Ambulatory Visit: Payer: Self-pay

## 2020-04-29 DIAGNOSIS — Z803 Family history of malignant neoplasm of breast: Secondary | ICD-10-CM

## 2020-04-29 MED ORDER — GADOBUTROL 1 MMOL/ML IV SOLN
6.0000 mL | Freq: Once | INTRAVENOUS | Status: AC | PRN
  Administered 2020-04-29: 6 mL via INTRAVENOUS

## 2020-08-05 ENCOUNTER — Other Ambulatory Visit: Payer: Self-pay | Admitting: General Surgery

## 2020-08-13 NOTE — H&P (Signed)
Subjective:     Patient ID: Caroline Friedman is a 45 y.o. female.  HPI  Patient of Dr. Donell Beers returns for follow up discussion breast reconstruction. Patient is scheduled for bilateral risk reducing mastectomies next month. Last seen 02/2019. Patient's mother, MA, MGM have history breast ca. Genetic testing in mother negative; patient herself has not had testing. 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 Required heparin/Lovenox during pregnancies. Wt up 14 lb since last visit.  MRI 04/2020 as below. MMG 07/2019 [facility]  History mastopexy with Dr. Stephens November age 74. Per patient he did not want to do augmentation given familial cancer history. Underwent submuscular augmentation with saline implants with Dr. Rutherford Guys age 62, has implant card at home, reports 250 ml one side and 270 ml other side. Prior to augmentation "very small." Happy with current volume. Wt stable.  Currently vaping with nicotine.  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 Husband is 7th grade teacher. Has two children.  BILATERAL BREAST MRI WITH AND WITHOUT CONTRAST  TECHNIQUE: Multiplanar, multisequence MR images of both breasts were obtained prior to and following the intravenous administration of 6 ml of Gadavist  Three-dimensional MR images were rendered by post-processing of the original MR data on an independent workstation. The three-dimensional MR images were interpreted, and findings are reported in the following complete MRI report for this study. Three dimensional images were evaluated at the independent interpreting workstation using the DynaCAD thin client.  COMPARISON: 07/23/2019 screening mammogram from [facility]; MRI 02/02/2019 and 12/31/2017  FINDINGS: Breast composition: c. Heterogeneous fibroglandular tissue.  Background parenchymal enhancement:  Mild  Right breast: Patient has retropectoral saline implant. The within the immediate retroareolar region of the RIGHT breast, there are 3 enhancing masses. Passes are oval with circumscribed margins and persistent type enhancement kinetics. These measure 0.4, 0.4, and 0.2 centimeters. Just anterior to these masses, there is a 2 millimeter circumscribed oval mass along the MEDIAL aspect of the areola. Lesion is subdermal in location and not amenable to core biopsy. These masses span 2.4 x 0.9 centimeters. These enhancing masses are new since previous exams. Masses are best identified on images 66, 69, and 72 of series 6 and images 1-8 of series 20947.  Left breast: No mass or abnormal enhancement.  Lymph nodes: No abnormal appearing lymph nodes.  Ancillary findings: None.  IMPRESSION: Suspicious masses in the retroareolar region of the RIGHT breast warranting tissue diagnosis.  RECOMMENDATION: Recommend targeted RIGHT breast ultrasound to determine if there is a sonographic correlate for the MRI findings. If no sonographic correlate can be identified, recommend MR guided biopsy versus MR guided placement of a sonographically visible clip so that excision can be performed.  BI-RADS CATEGORY 4: Suspicious.   Electronically Signed By: Norva Pavlov M.D. On: 05/01/2020 11:33  Review of Systems Remainder 12 point review negative    Objective:   Physical Exam  Cardiovascular: Normal rate, regular rhythm and normal heart sounds.  Pulmonary/Chest: Effort normal and breath sounds normal.  Abdominal:  Minimal soft tissue for reconstruction  Neurological: She is alert.   No masses no ptosis Bilateral wise pattern scars, no contracture, minor rippling when bending forward lateral breasts Asymmetry IMF with left some inferior malposition +bilateral animation Asymmetry diameter NAC SN to nipple R 19 L 19 cm BW R 17 L 17 cm CW 15 cm Chest midline to nipple  R 9 L 9.5 cm  Nipple to IMF R 10 L 10.5 cm Soft tissue pinch over implants 3 cm    Assessment:     Family history breast cancer Hx submuscular saline augmentation mammaplasty    Plan:     Needs to be off all nicotine products prior to surgery.  MRI as above- per patient underwent one benign biopsy at [facility].    Plan bilateral SSM with resection of NAC and placement tissue expanders, acellular dermis. Patient notes NAC since mastopexy visible outside bathing suit and bra. She also has asymmetries NAC position and size and this would carry through NSM.  Also notes her friend and my patient that recently passed from metastatic breast ca expressed feels she wishes she did not preserve NAC. Counseled will utilize vertical scar, this will be longer and may be visible in clothing or bathing suits. Reviewed risk mastectomy flap necrosis requiring additional surgery, this can be increased in setting nicotine use.  Reviewed my preference to stage reconstruction with placement expander at time mastectomies, she is agreeable to this.   Discussed use ADM in breast reconstruction, cadaveric source. Reviewed where this is placed in dual plane vs prepectoral reconstruction.Reviewed she has significant animation presently, this will become more noticeable post mastectomies.Options to continue with reconstruction in dual plane position vs switch to prepectoral. Counseled both are safe, she has to make decision for herself if possible increased risk rippling over superior poles or animation is of greater concern to her. Plan switch to prepectoral position.  Additional risks including but not limited to seroma hematoma bleeding blood clots in legs or lungs need for additional surgery unacceptable cosmetic result damage to adjacent structures reviewed.  Drain teaching completed. Rx for tramadol robaxin and bactrim given. Reviewed post op limitations.

## 2020-08-19 NOTE — Pre-Procedure Instructions (Signed)
Surgical Instructions    Your procedure is scheduled on Thursday June 9th.  Report to [facility] Main Entrance "A" at 0530 A.M., then check in with the Admitting office.  Call this number if you have problems the morning of surgery:  [phone]   If you have any questions prior to your surgery date call [phone]: Open Monday-Friday 8am-4pm    Remember:  Do not eat after midnight the night before your surgery  You may drink clear liquids until 04:30 A.M. the morning of your surgery.   Clear liquids allowed are: Water, Non-Citrus Juices (without pulp), Carbonated Beverages, Clear Tea, Black Coffee Only, and Gatorade  Patient Instructions  . The night before surgery:  o No food after midnight. ONLY clear liquids after midnight  . The day of surgery (if you do NOT have diabetes):  o Drink ONE (1) Pre-Surgery Clear Ensure by 04:30 A.M. the morning of surgery. Drink in one sitting. Do not sip.  o This drink was given to you during your hospital  pre-op appointment visit.  o Nothing else to drink after completing the  Pre-Surgery Clear Ensure.        If you have questions, please contact your surgeon's office.   Take these medicines the morning of surgery with A SIP OF WATER   ALPRAZolam (XANAX)- If needed  docusate sodium (COLACE)    As of today, STOP taking any Aspirin (unless otherwise instructed by your surgeon) Aleve, Naproxen, Ibuprofen, Motrin, Advil, Goody's, BC's, all herbal medications, fish oil, and all vitamins.                     Do not wear jewelry, make up, or nail polish DO Not wear nail polish, gel polish, artificial nails, or any other type of covering on natural nails including finger and toenails. If patients have artificial nails, gel coating, etc. that need to be removed by a nail saloon please have this removed prior to surgery or surgery may need to be canceled/delayed if the surgeon/ anesthesia feels like the patient is unable to be adequately  monitored.            Do not wear lotions, powders, perfumes/colognes, or deodorant.            Do not shave 48 hours prior to surgery.  Men may shave face and neck.            Do not bring valuables to the hospital.            [facility] is not responsible for any belongings or valuables.  Do NOT Smoke (Tobacco/Vaping) or drink Alcohol 24 hours prior to your procedure If you use a CPAP at night, you may bring all equipment for your overnight stay.   Contacts, glasses, dentures or bridgework may not be worn into surgery, please bring cases for these belongings   For patients admitted to the hospital, discharge time will be determined by your treatment team.   Patients discharged the day of surgery will not be allowed to drive home, and someone needs to stay with them for 24 hours.    Special instructions:    Oral Hygiene is also important to reduce your risk of infection.  Remember - BRUSH YOUR TEETH THE MORNING OF SURGERY WITH YOUR REGULAR TOOTHPASTE   [place] [facility]- Preparing For Surgery  Before surgery, you can play an important role. Because skin is not sterile, your skin needs to be as free of germs as  possible. You can reduce the number of germs on your skin by washing with CHG (chlorahexidine gluconate) Soap before surgery.  CHG is an antiseptic cleaner which kills germs and bonds with the skin to continue killing germs even after washing.     Please do not use if you have an allergy to CHG or antibacterial soaps. If your skin becomes reddened/irritated stop using the CHG.  Do not shave (including legs and underarms) for at least 48 hours prior to first CHG shower. It is OK to shave your face.  Please follow these instructions carefully.    1.  Shower the NIGHT BEFORE SURGERY and the MORNING OF SURGERY with CHG Soap.   If you chose to wash your hair, wash your hair first as usual with your normal shampoo. After you shampoo, rinse your hair and body thoroughly to remove the  shampoo.  Then [employer] and genitals (private parts) with your normal soap and rinse thoroughly to remove soap.  2. After that Use CHG Soap as you would any other liquid soap. You can apply CHG directly to the skin and wash gently with a scrungie or a clean washcloth.   3. Apply the CHG Soap to your body ONLY FROM THE NECK DOWN.  Do not use on open wounds or open sores. Avoid contact with your eyes, ears, mouth and genitals (private parts). Wash Face and genitals (private parts)  with your normal soap.   4. Wash thoroughly, paying special attention to the area where your surgery will be performed.  5. Thoroughly rinse your body with warm water from the neck down.  6. DO NOT shower/wash with your normal soap after using and rinsing off the CHG Soap.  7. Pat yourself dry with a CLEAN TOWEL.  8. Wear CLEAN PAJAMAS to bed the night before surgery  9. Place CLEAN SHEETS on your bed the night before your surgery  10. DO NOT SLEEP WITH PETS.   Day of Surgery:  Take a shower with CHG soap. Wear Clean/Comfortable clothing the morning of surgery Do not apply any deodorants/lotions.   Remember to brush your teeth WITH YOUR REGULAR TOOTHPASTE.   Please read over the following fact sheets that you were given.

## 2020-08-20 ENCOUNTER — Encounter (HOSPITAL_COMMUNITY)
Admission: RE | Admit: 2020-08-20 | Discharge: 2020-08-20 | Disposition: A | Discharge status: Home / Self Care | Payer: BC Managed Care – PPO | Source: Ambulatory Visit | Attending: General Surgery | Admitting: General Surgery

## 2020-08-20 ENCOUNTER — Other Ambulatory Visit: Payer: Self-pay

## 2020-08-20 ENCOUNTER — Encounter (HOSPITAL_COMMUNITY): Payer: Self-pay

## 2020-08-20 DIAGNOSIS — I4510 Unspecified right bundle-branch block: Secondary | ICD-10-CM | POA: Diagnosis not present

## 2020-08-20 DIAGNOSIS — Z01818 Encounter for other preprocedural examination: Secondary | ICD-10-CM | POA: Diagnosis present

## 2020-08-20 LAB — BASIC METABOLIC PANEL
Anion gap: 9 (ref 5–15)
BUN: 13 mg/dL (ref 6–20)
CO2: 24 mmol/L (ref 22–32)
Calcium: 8.9 mg/dL (ref 8.9–10.3)
Chloride: 99 mmol/L (ref 98–111)
Creatinine, Ser: 0.55 mg/dL (ref 0.44–1.00)
GFR, Estimated: >60 mL/min (ref >60)
Glucose, Bld: 102 mg/dL — ABNORMAL HIGH (ref 70–99)
Potassium: 3.3 mmol/L — ABNORMAL LOW (ref 3.5–5.1)
Sodium: 132 mmol/L — ABNORMAL LOW (ref 135–145)

## 2020-08-20 LAB — CBC
HCT: 40 % (ref 36.0–46.0)
Hemoglobin: 13.1 g/dL (ref 12.0–15.0)
MCH: 31.1 pg (ref 26.0–34.0)
MCHC: 32.8 g/dL (ref 30.0–36.0)
MCV: 95 fL (ref 80.0–100.0)
Platelets: 360 10*3/uL (ref 150–400)
RBC: 4.21 MIL/uL (ref 3.87–5.11)
RDW: 12.4 % (ref 11.5–15.5)
WBC: 8.5 10*3/uL (ref 4.0–10.5)
nRBC: 0 % (ref 0.0–0.2)

## 2020-08-20 NOTE — Progress Notes (Addendum)
PCP - Georgann Housekeeper Cardiologist - denies  Chest x-ray - not needed EKG - 08/20/20 Stress Test - denies ECHO - denies Cardiac Cath - denies   ERAS Protcol - clears until 0430 PRE-SURGERY Ensure or G2- ensure   COVID TEST- 08/24/20   Anesthesia review: yes RBBB patient stated she had this in 2005 when she lived in [place] no further workup per patient   Patient denies shortness of breath, fever, cough and chest pain at PAT appointment   All instructions explained to the patient, with a verbal understanding of the material. Patient agrees to go over the instructions while at home for a better understanding. Patient also instructed to self quarantine after being tested for COVID-19. The opportunity to ask questions was provided.

## 2020-08-20 NOTE — Pre-Procedure Instructions (Signed)
Surgical Instructions    Your procedure is scheduled on Thursday June 9th.  Report to [facility] Main Entrance "A" at 0530 A.M., then check in with the Admitting office.  Call this number if you have problems the morning of surgery:  [phone]   If you have any questions prior to your surgery date call [phone]: Open Monday-Friday 8am-4pm    Remember:  Do not eat after midnight the night before your surgery  You may drink clear liquids until 04:30 A.M. the morning of your surgery.   Clear liquids allowed are: Water, Non-Citrus Juices (without pulp), Carbonated Beverages, Clear Tea, Black Coffee Only, and Gatorade  Patient Instructions  Please complete your PRE-SURGERY ENSURE that was provided to you by 0430 am the morning of surgery.  Please, if able, drink it in one setting. DO NOT SIP.    Take these medicines the morning of surgery with A SIP OF WATER  ALPRAZolam Prudy Feeler)- If needed Eye drops if needed venlafaxine XR (EFFEXOR-XR)   As of today, STOP taking any Aspirin (unless otherwise instructed by your surgeon) Aleve, Naproxen, Ibuprofen, Motrin, Advil, Goody's, BC's, all herbal medications, fish oil, and all vitamins.                     Do not wear jewelry, make up            Do not shave 48 hours prior to surgery.  Men may shave face and neck.            Do not bring valuables to the hospital. DO Not wear nail polish, gel polish, artificial nails, or any other type of covering on natural nails including finger and toenails. If patients have artificial nails, gel coating, etc. that need to be removed by a nail saloon please have this removed prior to surgery or surgery may need to be canceled/delayed if the surgeon/ anesthesia feels like the patient is unable to be adequately monitored.            Do not wear lotions, powders, perfumes/colognes, or deodorant.             [facility] is not responsible for any belongings or valuables.  Do NOT Smoke (Tobacco/Vaping) or  drink Alcohol 24 hours prior to your procedure If you use a CPAP at night, you may bring all equipment for your overnight stay.   Contacts, glasses, dentures or bridgework may not be worn into surgery, please bring cases for these belongings   For patients admitted to the hospital, discharge time will be determined by your treatment team.   Patients discharged the day of surgery will not be allowed to drive home, and someone needs to stay with them for 24 hours.    Special instructions:    Oral Hygiene is also important to reduce your risk of infection.  Remember - BRUSH YOUR TEETH THE MORNING OF SURGERY WITH YOUR REGULAR TOOTHPASTE   [facility]- Preparing For Surgery  Before surgery, you can play an important role. Because skin is not sterile, your skin needs to be as free of germs as possible. You can reduce the number of germs on your skin by washing with CHG (chlorahexidine gluconate) Soap before surgery.  CHG is an antiseptic cleaner which kills germs and bonds with the skin to continue killing germs even after washing.     Please do not use if you have an allergy to CHG or antibacterial soaps. If your skin becomes reddened/irritated stop  using the CHG.  Do not shave (including legs and underarms) for at least 48 hours prior to first CHG shower. It is OK to shave your face.  Please follow these instructions carefully.    1.  Shower the NIGHT BEFORE SURGERY and the MORNING OF SURGERY with CHG Soap.   If you chose to wash your hair, wash your hair first as usual with your normal shampoo. After you shampoo, rinse your hair and body thoroughly to remove the shampoo.  Then [employer] and genitals (private parts) with your normal soap and rinse thoroughly to remove soap.  2. After that Use CHG Soap as you would any other liquid soap. You can apply CHG directly to the skin and wash gently with a scrungie or a clean washcloth.   3. Apply the CHG Soap to your body ONLY FROM THE NECK DOWN.   Do not use on open wounds or open sores. Avoid contact with your eyes, ears, mouth and genitals (private parts). Wash Face and genitals (private parts)  with your normal soap.   4. Wash thoroughly, paying special attention to the area where your surgery will be performed.  5. Thoroughly rinse your body with warm water from the neck down.  6. DO NOT shower/wash with your normal soap after using and rinsing off the CHG Soap.  7. Pat yourself dry with a CLEAN TOWEL.  8. Wear CLEAN PAJAMAS to bed the night before surgery  9. Place CLEAN SHEETS on your bed the night before your surgery  10. DO NOT SLEEP WITH PETS.   Day of Surgery:  Take a shower with CHG soap. Wear Clean/Comfortable clothing the morning of surgery Do not apply any deodorants/lotions.   Remember to brush your teeth WITH YOUR REGULAR TOOTHPASTE.   Please read over the following fact sheets that you were given.

## 2020-08-21 ENCOUNTER — Other Ambulatory Visit (HOSPITAL_COMMUNITY): Payer: BC Managed Care – PPO

## 2020-08-24 ENCOUNTER — Other Ambulatory Visit (HOSPITAL_COMMUNITY)
Admission: RE | Admit: 2020-08-24 | Discharge: 2020-08-24 | Disposition: A | Discharge status: Home / Self Care | Payer: BC Managed Care – PPO | Source: Ambulatory Visit | Attending: General Surgery | Admitting: General Surgery

## 2020-08-24 DIAGNOSIS — Z20822 Contact with and (suspected) exposure to covid-19: Secondary | ICD-10-CM | POA: Diagnosis not present

## 2020-08-24 DIAGNOSIS — Z01812 Encounter for preprocedural laboratory examination: Secondary | ICD-10-CM | POA: Diagnosis present

## 2020-08-24 LAB — SARS CORONAVIRUS 2 (TAT 6-24 HRS): SARS Coronavirus 2: NEGATIVE

## 2020-08-24 NOTE — H&P (Signed)
Caroline Friedman Location: [facility] Surgery Patient #: [id number] DOB: [birth year] Married / Language: English / Race: White Female   History of Present Illness  The patient is a 45 year old female who presents for a follow-up for Breast problems. Pt is a 45 yo F who is the daughter of one of Dr. Tenna Child old patients who I follow for breast cancer survivorship. I am seeing her for high risk of breast cancer. The patient has dense breasts and a significant family history of breast cancer. Her mother, maternal grandmother, maternal aunt, and paternal grandmother all had breast cancer. Her mother's genetic testing was negative, but she had bilateral breast cancer. She remains quite concerned about her risk of getting breast cancer. She denies significant breast pain. She has not felt a mass. She is otherwise healthy.   Of note, she has history of mastopexy around age 80 and now has retropectoral breasts implants (saline).   She had negative neg mammogram 07/2019 [facility] BIRADS 1). She was at home this past year due to COVID. She unfortunately lost her brother to COVID in February 2021. He lived in [place] and was against masking.   In the intervening time, she had MRI 04/2020 that showed three tiny new masses. Biopsy is recommended. She has seen Dr. Leta Baptist. We will likely proceed to bilateral mastectomies this summer for prophylaxis.   MR breast 04/29/20 CLINICAL DATA: Strong family history of breast cancer. [name] mother was diagnosed at age 85, paternal grandmother at age 89, maternal grandmother at age 18, and 2 cousins diagnosed in their 39s and 60s.  LABS: None obtained at the time of imaging.  EXAM: BILATERAL BREAST MRI WITH AND WITHOUT CONTRAST  TECHNIQUE: Multiplanar, multisequence MR images of both breasts were obtained prior to and following the intravenous administration of 6 ml of Gadavist  Three-dimensional MR images were rendered by post-processing  of the original MR data on an independent workstation. The three-dimensional MR images were interpreted, and findings are reported in the following complete MRI report for this study. Three dimensional images were evaluated at the independent interpreting workstation using the DynaCAD thin client.  COMPARISON: 07/23/2019 screening mammogram from [facility]; MRI 02/02/2019 and 12/31/2017  FINDINGS: Breast composition: c. Heterogeneous fibroglandular tissue.  Background parenchymal enhancement: Mild  Right breast: Patient has retropectoral saline implant. The within the immediate retroareolar region of the RIGHT breast, there are 3 enhancing masses. Passes are oval with circumscribed margins and persistent type enhancement kinetics. These measure 0.4, 0.4, and 0.2 centimeters. Just anterior to these masses, there is a 2 millimeter circumscribed oval mass along the MEDIAL aspect of the areola. Lesion is subdermal in location and not amenable to core biopsy. These masses span 2.4 x 0.9 centimeters. These enhancing masses are new since previous exams. Masses are best identified on images 66, 69, and 72 of series 6 and images 1-8 of series 76195.  Left breast: No mass or abnormal enhancement.  Lymph nodes: No abnormal appearing lymph nodes.  Ancillary findings: None.  IMPRESSION: Suspicious masses in the retroareolar region of the RIGHT breast warranting tissue diagnosis.  RECOMMENDATION: Recommend targeted RIGHT breast ultrasound to determine if there is a sonographic correlate for the MRI findings. If no sonographic correlate can be identified, recommend MR guided biopsy versus MR guided placement of a sonographically visible clip so that excision can be performed.  BI-RADS CATEGORY 4: Suspicious.      Allergies  No Known Drug Allergies Allergies Reconciled   Medication  History Vitamin D (Ergocalciferol) (1.25 MG(50000 UT) Capsule, Oral)  Active. Venlafaxine HCl ER (75MG  Capsule ER 24HR, Oral) Active. ALPRAZolam (1MG  Tablet, Oral) Active. ValACYclovir HCl (500MG  Tablet, Oral) Active. Aspirin (81MG  Tablet, Oral) Active. Folic Acid (1MG  Tablet, Oral) Active. Medications Reconciled    Review of Systems  All other systems negative   Physical Exam General Mental Status-Alert. General Appearance-Consistent with stated age. Hydration-Well hydrated. Voice-Normal. Note: very fit.   Head and Neck Head-normocephalic, atraumatic with no lesions or palpable masses.  Eye Sclera/Conjunctiva - Bilateral-No scleral icterus.  Chest and Lung Exam Chest and lung exam reveals -quiet, even and easy respiratory effort with no use of accessory muscles. Inspection Chest Wall - Normal. Back - normal.  Breast Note: no palpable breast masses. no skin dimpling. no nipple discharge or nipple retraction. No LAD. breasts implants feel intact. Left implant more laterally displaced.   Cardiovascular Cardiovascular examination reveals -normal pedal pulses bilaterally. Note: regular rate and rhythm  Abdomen Inspection-Inspection Normal. Palpation/Percussion Palpation and Percussion of the abdomen reveal - Soft, Non Tender, No Rebound tenderness, No Rigidity (guarding) and No hepatosplenomegaly.  Peripheral Vascular Upper Extremity Inspection - Bilateral - Normal - No Clubbing, No Cyanosis, No Edema, Pulses Intact. Lower Extremity Palpation - Edema - Bilateral - No edema - Bilateral. Note: small varicose vein on right calf. not discolored.   Neurologic Neurologic evaluation reveals -alert and oriented x 3 with no impairment of recent or remote memory. Mental Status-Normal.  Musculoskeletal Global Assessment -Note: no gross deformities.  Normal Exam - Left-Upper Extremity Strength Normal and Lower Extremity Strength Normal. Normal Exam - Right-Upper Extremity Strength Normal and  Lower Extremity Strength Normal.  Lymphatic Head & Neck  General Head & Neck Lymphatics: Bilateral - Description - Normal. Axillary  General Axillary Region: Bilateral - Description - Normal. Tenderness - Non Tender.    Assessment & Plan  FAMILY HISTORY OF BREAST CANCER IN MOTHER (Z80.3) Impression: Pt has ultrasound and possible biopsy ordered. If small masses can't be seen, will do MRI guided biopsy.  She would like to have mastectomies this summer. I told her it is still important to do biopsies because if cancer is present, she will need sentinel lymph node biopsy. Current Plans Follow up with in the office in 6 MONTHS.   Call sooner as needed.  S/P BILATERAL BREAST IMPLANTS (Z98.82) Impression: Will get pre pec implants. might can go straight to implant and skip expander.

## 2020-08-27 ENCOUNTER — Encounter (HOSPITAL_COMMUNITY)
Admission: RE | Disposition: A | Discharge status: Home / Self Care | Payer: Self-pay | Source: Home / Self Care | Attending: Plastic Surgery

## 2020-08-27 ENCOUNTER — Observation Stay (HOSPITAL_COMMUNITY)
Admission: RE | Admit: 2020-08-27 | Discharge: 2020-08-28 | Disposition: A | Discharge status: Home / Self Care | Payer: BC Managed Care – PPO | Attending: Plastic Surgery | Admitting: Plastic Surgery

## 2020-08-27 ENCOUNTER — Encounter (HOSPITAL_COMMUNITY): Payer: Self-pay | Admitting: General Surgery

## 2020-08-27 ENCOUNTER — Ambulatory Visit (HOSPITAL_COMMUNITY): Payer: BC Managed Care – PPO | Admitting: Anesthesiology

## 2020-08-27 ENCOUNTER — Other Ambulatory Visit: Payer: Self-pay

## 2020-08-27 DIAGNOSIS — N6022 Fibroadenosis of left breast: Secondary | ICD-10-CM | POA: Diagnosis not present

## 2020-08-27 DIAGNOSIS — Z9189 Other specified personal risk factors, not elsewhere classified: Secondary | ICD-10-CM

## 2020-08-27 DIAGNOSIS — Z803 Family history of malignant neoplasm of breast: Secondary | ICD-10-CM | POA: Diagnosis not present

## 2020-08-27 DIAGNOSIS — Z7982 Long term (current) use of aspirin: Secondary | ICD-10-CM | POA: Insufficient documentation

## 2020-08-27 DIAGNOSIS — N6021 Fibroadenosis of right breast: Secondary | ICD-10-CM | POA: Diagnosis not present

## 2020-08-27 DIAGNOSIS — Z1501 Genetic susceptibility to malignant neoplasm of breast: Secondary | ICD-10-CM | POA: Diagnosis not present

## 2020-08-27 HISTORY — PX: TOTAL MASTECTOMY: SHX6129

## 2020-08-27 HISTORY — PX: BREAST RECONSTRUCTION WITH PLACEMENT OF TISSUE EXPANDER AND ALLODERM: SHX6805

## 2020-08-27 LAB — POCT PREGNANCY, URINE: Preg Test, Ur: NEGATIVE

## 2020-08-27 SURGERY — MASTECTOMY, SIMPLE
Anesthesia: Regional | Site: Breast | Laterality: Bilateral

## 2020-08-27 MED ORDER — ACETAMINOPHEN 500 MG PO TABS
1000.0000 mg | ORAL_TABLET | ORAL | Status: AC
  Administered 2020-08-27: 1000 mg via ORAL
  Filled 2020-08-27: qty 2

## 2020-08-27 MED ORDER — FENTANYL CITRATE (PF) 250 MCG/5ML IJ SOLN
INTRAMUSCULAR | Status: AC
  Filled 2020-08-27: qty 5

## 2020-08-27 MED ORDER — ROCURONIUM BROMIDE 10 MG/ML (PF) SYRINGE
PREFILLED_SYRINGE | INTRAVENOUS | Status: AC
  Filled 2020-08-27: qty 10

## 2020-08-27 MED ORDER — KETOROLAC TROMETHAMINE 30 MG/ML IJ SOLN
30.0000 mg | Freq: Three times a day (TID) | INTRAMUSCULAR | Status: DC
  Administered 2020-08-27 – 2020-08-28 (×3): 30 mg via INTRAVENOUS
  Filled 2020-08-27 (×3): qty 1

## 2020-08-27 MED ORDER — FENTANYL CITRATE (PF) 100 MCG/2ML IJ SOLN
INTRAMUSCULAR | Status: AC
  Filled 2020-08-27: qty 2

## 2020-08-27 MED ORDER — KETAMINE HCL 50 MG/5ML IJ SOSY
PREFILLED_SYRINGE | INTRAMUSCULAR | Status: AC
  Filled 2020-08-27: qty 10

## 2020-08-27 MED ORDER — PROPOFOL 10 MG/ML IV BOLUS
INTRAVENOUS | Status: AC
  Filled 2020-08-27: qty 20

## 2020-08-27 MED ORDER — KETAMINE HCL 10 MG/ML IJ SOLN
INTRAMUSCULAR | Status: DC | PRN
  Administered 2020-08-27 (×2): 10 mg via INTRAVENOUS
  Administered 2020-08-27: 40 mg via INTRAVENOUS
  Administered 2020-08-27: 10 mg via INTRAVENOUS

## 2020-08-27 MED ORDER — MIDAZOLAM HCL 5 MG/5ML IJ SOLN
INTRAMUSCULAR | Status: DC | PRN
  Administered 2020-08-27: 1 mg via INTRAVENOUS
  Administered 2020-08-27: 2 mg via INTRAVENOUS
  Administered 2020-08-27: 1 mg via INTRAVENOUS

## 2020-08-27 MED ORDER — ROCURONIUM BROMIDE 100 MG/10ML IV SOLN
INTRAVENOUS | Status: DC | PRN
  Administered 2020-08-27: 30 mg via INTRAVENOUS
  Administered 2020-08-27: 10 mg via INTRAVENOUS
  Administered 2020-08-27: 20 mg via INTRAVENOUS
  Administered 2020-08-27: 50 mg via INTRAVENOUS
  Administered 2020-08-27 (×3): 20 mg via INTRAVENOUS
  Administered 2020-08-27: 30 mg via INTRAVENOUS

## 2020-08-27 MED ORDER — PROPOFOL 10 MG/ML IV BOLUS
INTRAVENOUS | Status: DC | PRN
  Administered 2020-08-27 (×3): 50 mg via INTRAVENOUS
  Administered 2020-08-27: 100 mg via INTRAVENOUS
  Administered 2020-08-27 (×2): 50 mg via INTRAVENOUS

## 2020-08-27 MED ORDER — DEXAMETHASONE SODIUM PHOSPHATE 10 MG/ML IJ SOLN
INTRAMUSCULAR | Status: DC | PRN
  Administered 2020-08-27: 8 mg via INTRAVENOUS

## 2020-08-27 MED ORDER — ONDANSETRON HCL 4 MG/2ML IJ SOLN
INTRAMUSCULAR | Status: AC
  Filled 2020-08-27: qty 2

## 2020-08-27 MED ORDER — PROPOFOL 1000 MG/100ML IV EMUL
INTRAVENOUS | Status: AC
  Filled 2020-08-27: qty 200

## 2020-08-27 MED ORDER — LOSARTAN POTASSIUM 25 MG PO TABS
25.0000 mg | ORAL_TABLET | Freq: Every day | ORAL | Status: DC
  Administered 2020-08-27 – 2020-08-28 (×2): 25 mg via ORAL
  Filled 2020-08-27 (×2): qty 1

## 2020-08-27 MED ORDER — DEXMEDETOMIDINE (PRECEDEX) IN NS 20 MCG/5ML (4 MCG/ML) IV SYRINGE
PREFILLED_SYRINGE | INTRAVENOUS | Status: AC
  Filled 2020-08-27: qty 5

## 2020-08-27 MED ORDER — HYDROMORPHONE HCL 1 MG/ML IJ SOLN
0.5000 mg | INTRAMUSCULAR | Status: DC | PRN
Start: 2020-08-27 — End: 2020-08-28

## 2020-08-27 MED ORDER — MIDAZOLAM HCL 2 MG/2ML IJ SOLN
INTRAMUSCULAR | Status: AC
  Filled 2020-08-27: qty 4

## 2020-08-27 MED ORDER — CHLORHEXIDINE GLUCONATE CLOTH 2 % EX PADS: 6.0000 | MEDICATED_PAD | Freq: Once | CUTANEOUS | Status: DC

## 2020-08-27 MED ORDER — LACTATED RINGERS IV SOLN: INTRAVENOUS | Status: DC

## 2020-08-27 MED ORDER — VENLAFAXINE HCL ER 75 MG PO CP24
150.0000 mg | ORAL_CAPSULE | Freq: Every day | ORAL | Status: DC
  Administered 2020-08-27: 150 mg via ORAL
  Filled 2020-08-27 (×2): qty 2

## 2020-08-27 MED ORDER — SODIUM CHLORIDE 0.9 % IV SOLN
INTRAVENOUS | Status: DC | PRN
  Administered 2020-08-27: 500 mL

## 2020-08-27 MED ORDER — CHLORHEXIDINE GLUCONATE 0.12 % MT SOLN: 15.0000 mL | Freq: Once | OROMUCOSAL | Status: AC

## 2020-08-27 MED ORDER — SUGAMMADEX SODIUM 200 MG/2ML IV SOLN
INTRAVENOUS | Status: DC | PRN
  Administered 2020-08-27: 200 mg via INTRAVENOUS

## 2020-08-27 MED ORDER — CHLORHEXIDINE GLUCONATE 0.12 % MT SOLN
OROMUCOSAL | Status: AC
  Administered 2020-08-27: 15 mL via OROMUCOSAL
  Filled 2020-08-27: qty 15

## 2020-08-27 MED ORDER — ENSURE PRE-SURGERY PO LIQD: 296.0000 mL | Freq: Once | ORAL | Status: DC

## 2020-08-27 MED ORDER — ORAL CARE MOUTH RINSE: 15.0000 mL | Freq: Once | OROMUCOSAL | Status: AC

## 2020-08-27 MED ORDER — FENTANYL CITRATE (PF) 250 MCG/5ML IJ SOLN
INTRAMUSCULAR | Status: DC | PRN
  Administered 2020-08-27: 50 ug via INTRAVENOUS
  Administered 2020-08-27: 100 ug via INTRAVENOUS
  Administered 2020-08-27: 50 ug via INTRAVENOUS
  Administered 2020-08-27: 150 ug via INTRAVENOUS
  Administered 2020-08-27: 100 ug via INTRAVENOUS
  Administered 2020-08-27 (×4): 50 ug via INTRAVENOUS
  Administered 2020-08-27: 100 ug via INTRAVENOUS

## 2020-08-27 MED ORDER — BRIMONIDINE TARTRATE 0.025 % OP SOLN: 1.0000 [drp] | Freq: Every day | OPHTHALMIC | Status: DC

## 2020-08-27 MED ORDER — CEFAZOLIN SODIUM-DEXTROSE 1-4 GM/50ML-% IV SOLN
1.0000 g | Freq: Three times a day (TID) | INTRAVENOUS | Status: AC
  Administered 2020-08-27 – 2020-08-28 (×3): 1 g via INTRAVENOUS
  Filled 2020-08-27 (×3): qty 50

## 2020-08-27 MED ORDER — DEXAMETHASONE SODIUM PHOSPHATE 10 MG/ML IJ SOLN
INTRAMUSCULAR | Status: AC
  Filled 2020-08-27: qty 1

## 2020-08-27 MED ORDER — ONDANSETRON 4 MG PO TBDP: 4.0000 mg | ORAL_TABLET | Freq: Four times a day (QID) | ORAL | Status: DC | PRN

## 2020-08-27 MED ORDER — CEFAZOLIN SODIUM-DEXTROSE 2-4 GM/100ML-% IV SOLN
2.0000 g | INTRAVENOUS | Status: AC
  Administered 2020-08-27: 2 g via INTRAVENOUS
  Filled 2020-08-27: qty 100

## 2020-08-27 MED ORDER — ONDANSETRON HCL 4 MG/2ML IJ SOLN
INTRAMUSCULAR | Status: DC | PRN
  Administered 2020-08-27: 4 mg via INTRAVENOUS

## 2020-08-27 MED ORDER — KCL IN DEXTROSE-NACL 20-5-0.45 MEQ/L-%-% IV SOLN
INTRAVENOUS | Status: DC
  Filled 2020-08-27: qty 1000

## 2020-08-27 MED ORDER — OXYCODONE HCL 5 MG PO TABS
5.0000 mg | ORAL_TABLET | ORAL | Status: DC | PRN
  Administered 2020-08-27 – 2020-08-28 (×6): 10 mg via ORAL
  Filled 2020-08-27 (×6): qty 2

## 2020-08-27 MED ORDER — PROPOFOL 500 MG/50ML IV EMUL
INTRAVENOUS | Status: DC | PRN
  Administered 2020-08-27: 150 ug/kg/min via INTRAVENOUS

## 2020-08-27 MED ORDER — DEXMEDETOMIDINE (PRECEDEX) IN NS 20 MCG/5ML (4 MCG/ML) IV SYRINGE
PREFILLED_SYRINGE | INTRAVENOUS | Status: DC | PRN
  Administered 2020-08-27: 12 ug via INTRAVENOUS
  Administered 2020-08-27: 8 ug via INTRAVENOUS

## 2020-08-27 MED ORDER — ONDANSETRON HCL 4 MG/2ML IJ SOLN: 4.0000 mg | Freq: Four times a day (QID) | INTRAMUSCULAR | Status: DC | PRN

## 2020-08-27 MED ORDER — ENOXAPARIN SODIUM 40 MG/0.4ML IJ SOSY
40.0000 mg | PREFILLED_SYRINGE | INTRAMUSCULAR | Status: DC
  Administered 2020-08-28: 40 mg via SUBCUTANEOUS
  Filled 2020-08-27: qty 0.4

## 2020-08-27 MED ORDER — ALPRAZOLAM 0.25 MG PO TABS: 0.2500 mg | ORAL_TABLET | Freq: Two times a day (BID) | ORAL | Status: DC | PRN

## 2020-08-27 MED ORDER — METHOCARBAMOL 500 MG PO TABS
500.0000 mg | ORAL_TABLET | Freq: Four times a day (QID) | ORAL | Status: DC | PRN
  Administered 2020-08-27 – 2020-08-28 (×4): 500 mg via ORAL
  Filled 2020-08-27 (×4): qty 1

## 2020-08-27 SURGICAL SUPPLY — 74 items
ALLODERM 16X20 PERFORATED (Tissue) ×2 IMPLANT
ALLOGRAFT PERF 16X20 1.6+/-0.4 (Tissue) ×2 IMPLANT
BAG DECANTER FOR FLEXI CONT (MISCELLANEOUS) ×4 IMPLANT
BINDER BREAST LRG (GAUZE/BANDAGES/DRESSINGS) ×2 IMPLANT
BINDER BREAST XLRG (GAUZE/BANDAGES/DRESSINGS) IMPLANT
CANISTER SUCT 3000ML PPV (MISCELLANEOUS) ×12 IMPLANT
CHLORAPREP W/TINT 26 (MISCELLANEOUS) ×8 IMPLANT
CLOSURE WOUND 1/2 X4 (GAUZE/BANDAGES/DRESSINGS) ×1
COVER SURGICAL LIGHT HANDLE (MISCELLANEOUS) ×8 IMPLANT
COVER WAND RF STERILE (DRAPES) ×8 IMPLANT
DERMABOND ADHESIVE PROPEN (GAUZE/BANDAGES/DRESSINGS) ×2
DERMABOND ADVANCED (GAUZE/BANDAGES/DRESSINGS) ×6
DERMABOND ADVANCED .7 DNX12 (GAUZE/BANDAGES/DRESSINGS) ×6 IMPLANT
DERMABOND ADVANCED .7 DNX6 (GAUZE/BANDAGES/DRESSINGS) IMPLANT
DRAIN CHANNEL 15F RND FF W/TCR (WOUND CARE) ×4 IMPLANT
DRAIN CHANNEL 19F RND (DRAIN) ×4 IMPLANT
DRAPE CHEST BREAST 15X10 FENES (DRAPES) ×4 IMPLANT
DRAPE HALF SHEET 40X57 (DRAPES) ×4 IMPLANT
DRAPE INCISE IOBAN 66X45 STRL (DRAPES) ×2 IMPLANT
DRAPE ORTHO SPLIT 77X108 STRL (DRAPES) ×6
DRAPE SURG ORHT 6 SPLT 77X108 (DRAPES) ×4 IMPLANT
DRAPE WARM FLUID 44X44 (DRAPES) ×4 IMPLANT
DRSG PAD ABDOMINAL 8X10 ST (GAUZE/BANDAGES/DRESSINGS) ×8 IMPLANT
DRSG TEGADERM 4X4.5 CHG (GAUZE/BANDAGES/DRESSINGS) ×2 IMPLANT
DRSG TEGADERM 4X4.75 (GAUZE/BANDAGES/DRESSINGS) ×16 IMPLANT
ELECT BLADE 4.0 EZ CLEAN MEGAD (MISCELLANEOUS) ×4
ELECT CAUTERY BLADE 6.4 (BLADE) ×4 IMPLANT
ELECT COATED BLADE 2.86 ST (ELECTRODE) ×4 IMPLANT
ELECT REM PT RETURN 9FT ADLT (ELECTROSURGICAL) ×12
ELECTRODE BLDE 4.0 EZ CLN MEGD (MISCELLANEOUS) ×2 IMPLANT
ELECTRODE REM PT RTRN 9FT ADLT (ELECTROSURGICAL) ×6 IMPLANT
EVACUATOR SILICONE 100CC (DRAIN) ×8 IMPLANT
EXPANDER TISSUE FV FOURTE 400 (Prosthesis & Implant Plastic) IMPLANT
GAUZE SPONGE 4X4 12PLY STRL (GAUZE/BANDAGES/DRESSINGS) ×4 IMPLANT
GLOVE BIO SURGEON STRL SZ 6 (GLOVE) ×16 IMPLANT
GLOVE SURG UNDER LTX SZ6.5 (GLOVE) ×4 IMPLANT
GOWN STRL REUS W/ TWL LRG LVL3 (GOWN DISPOSABLE) ×6 IMPLANT
GOWN STRL REUS W/TWL 2XL LVL3 (GOWN DISPOSABLE) ×4 IMPLANT
GOWN STRL REUS W/TWL LRG LVL3 (GOWN DISPOSABLE) ×9
ILLUMINATOR WAVEGUIDE N/F (MISCELLANEOUS) IMPLANT
KIT BASIN OR (CUSTOM PROCEDURE TRAY) ×8 IMPLANT
KIT TURNOVER KIT B (KITS) ×8 IMPLANT
LIGHT WAVEGUIDE WIDE FLAT (MISCELLANEOUS) ×2 IMPLANT
MARKER SKIN DUAL TIP RULER LAB (MISCELLANEOUS) ×4 IMPLANT
NDL SPNL 18GX3.5 QUINCKE PK (NEEDLE) ×2 IMPLANT
NEEDLE SPNL 18GX3.5 QUINCKE PK (NEEDLE) ×4 IMPLANT
NS IRRIG 1000ML POUR BTL (IV SOLUTION) ×12 IMPLANT
PACK GENERAL/GYN (CUSTOM PROCEDURE TRAY) ×8 IMPLANT
PAD ARMBOARD 7.5X6 YLW CONV (MISCELLANEOUS) ×8 IMPLANT
PENCIL SMOKE EVACUATOR (MISCELLANEOUS) ×4 IMPLANT
PIN SAFETY STERILE (MISCELLANEOUS) ×4 IMPLANT
SET ASEPTIC TRANSFER (MISCELLANEOUS) ×4 IMPLANT
SOL PREP POV-IOD 4OZ 10% (MISCELLANEOUS) ×4 IMPLANT
STAPLER VISISTAT 35W (STAPLE) ×4 IMPLANT
STRIP CLOSURE SKIN 1/2X4 (GAUZE/BANDAGES/DRESSINGS) ×3 IMPLANT
SUT CHROMIC 4 0 PS 2 18 (SUTURE) ×10 IMPLANT
SUT ETHILON 2 0 FS 18 (SUTURE) ×6 IMPLANT
SUT MNCRL AB 4-0 PS2 18 (SUTURE) ×8 IMPLANT
SUT MON AB 4-0 PC3 18 (SUTURE) ×4 IMPLANT
SUT SILK 2 0 SH (SUTURE) ×2 IMPLANT
SUT VIC AB 0 CT2 27 (SUTURE) ×8 IMPLANT
SUT VIC AB 3-0 SH 27 (SUTURE) ×8
SUT VIC AB 3-0 SH 27X BRD (SUTURE) IMPLANT
SUT VIC AB 3-0 SH 8-18 (SUTURE) ×4 IMPLANT
SUT VICRYL 4-0 PS2 18IN ABS (SUTURE) ×2 IMPLANT
SUT VLOC 180 0 24IN GS25 (SUTURE) ×8 IMPLANT
SYR 50ML LL SCALE MARK (SYRINGE) ×8 IMPLANT
SYR BULB IRRIG 60ML STRL (SYRINGE) ×4 IMPLANT
TISSUE EXPNDR FV FOURTE 400 (Prosthesis & Implant Plastic) ×8 IMPLANT
TOWEL GREEN STERILE (TOWEL DISPOSABLE) ×8 IMPLANT
TOWEL GREEN STERILE FF (TOWEL DISPOSABLE) ×8 IMPLANT
TRAY FOLEY MTR SLVR 16FR STAT (SET/KITS/TRAYS/PACK) IMPLANT
TUBE CONNECTING 12'X1/4 (SUCTIONS) ×1
TUBE CONNECTING 12X1/4 (SUCTIONS) ×3 IMPLANT

## 2020-08-27 NOTE — Op Note (Signed)
Operative Note   DATE OF OPERATION: 6.9.22  LOCATION: [facility] Main OR-observation  SURGICAL DIVISION: Plastic Surgery  PREOPERATIVE DIAGNOSES:  1. Family history breast cancer 2. History submuscular saline augmentation  POSTOPERATIVE DIAGNOSES:  same  PROCEDURE:  1. Bilateral breast reconstruction with tissue expanders 2. Acellular dermis (Alloderm) to bilateral chest 600 cm2  SURGEON: Glenna Fellows MD MBA  ASSISTANT: Tarry Kos RNFA  ANESTHESIA:  General.   EBL: 75 ml for entire procedure  COMPLICATIONS: None immediate.   INDICATIONS FOR PROCEDURE:  The patient, Caroline Friedman, is a 45 y.o. female born on 04-05-1975, is here for immediate prepectoral tissue expander based reconstruction following bilateral skin sparing mastectomies.    FINDINGS: Removed intact smooth round saline implants with implant stamp "McGhan 360." Implant drained with approximately 400 ml saline present. Natrelle 133S FV-12 T 400 ml tissue expanders placed bilateral, initial fill volume 240 ml air. RIGHT SN [id number] LEFT [id number]  DESCRIPTION OF PROCEDURE:  The patient was marked with the patient in the preoperative area to mark sternal notch, chest midline, anterior axillary lines and inframammary folds. Patient was marked for skin reduction mastectomy with most superior portion nipple areola marked on breast meridian. Vertical limbs marked at lateral border areolae and set at 9 cm length. The patient was taken to the operating room. SCDs were placed and IV antibiotics were given. Foley catheter placed. [name] operative site was prepped and draped in a sterile fashion. A time out was performed and all information was confirmed to be correct. I assisted in mastectomies in retraction and exposure. Following completion of mastectomies, reconstruction began on right side. Right pectoralis muscle returned to chest wall and sutured with 0 V lock suture. In supine position, the lateral limbs for resection marked  and area over lower pole preserved as inferiorly based dermal pedicle. Skin de epithelialized in this area.     The cavity was irrigated with saline followed by solution containing Ancef, gentamicin, and betadine. Hemostasis was ensured. A 19 Fr drain was placed in subcutaneous position laterally and a 15 Fr drain placed along inframammary fold. Each secured to skin with 2-0 nylon. The tissue expanders were prepared on back table prior in insertion. The expander was filled with air. Perforated acellular dermis was  draped over anterior surface expander. The ADM was then secured to itself over posterior surface of expander with 4-0 chromic. Redundant folds acellular dermis excised so that the ADM lay flat without folds over air filled expander. The expander was secured to medial insertion pectoralis with a 0 vicryl. The superior and lateral tabs also secured to pectoralis muscle with 0-vicryl. The ADM was secured to pectoralis muscle and chest wall along inferior border at inframammary fold with 0 v lock suture. Laterally the mastectomy flap over posterior axillary line was advanced anteriorly and the subcutaneous tissue and superficial fascia was secured to chest wall with 0-vicryl. The inferiorly based dermal pedicle was redraped superiorly over expander and acellular dermis and secured to pectoralis with interrupted 0-vicryl. Skin closure completed with 3-0 vicryl in fascial layer and 4-0 vicryl in dermis. Skin closure completed with 4-0 monocryl subcuticular and tissue adhesive.   I then directed my attention to left chest where similar irrigation and drain placement completed. Left pectoralis muscle returned to chest wall and sutured with 0 V lock suture. The prepared expander with ADM secured over anterior surface was placed in left chest and tabs secured to chest wall and pectoralis muscle with 0- vicryl suture. The acellular dermis  at inframammary fold was secured to chest wall with 0 V-lock suture.  Laterally the mastectomy flap over posterior axillary line was advanced anteriorly and the subcutaneous tissue and superficial fascia was secured to chest wall with 0-vicryl. The inferiorly based dermal pedicle was redraped superiorly over expander and acellular dermis and secured to pectoralis with interrupted 0-vicryl. Skin closure completed with 3-0 vicryl in fascial layer and 4-0 vicryl in dermis. Skin closure completed with 4-0 monocryl subcuticular and tissue adhesive. Tegaderms applied bilateral, followed by dry dressing and breast binder.   The patient was allowed to wake from anesthesia, extubated and taken to the recovery room in satisfactory condition.   SPECIMENS: none  DRAINS: 15 and 19 Fr JP in right and left subcutaneous chest

## 2020-08-27 NOTE — Op Note (Signed)
PRE-OPERATIVE DIAGNOSIS: strong family history of breast cancer, dense breasts, high risk for breast cancer  POST-OPERATIVE DIAGNOSIS:  Same  PROCEDURE:  Procedure(s): Bilateral mastectomies and removal of implants (followed by immediate expander reconstruction by Dr. Leta Baptist)  SURGEON:  Surgeon(s): [name], MD  ASSISTANT: Berenda Morale, RNFA  ANESTHESIA:   regional and general  DRAINS:  per Dr. Leta Baptist    LOCAL MEDICATIONS USED:  OTHER pectoral blocks with exparel/marcaine  SPECIMEN:  Source of Specimen:  right breast, left breast  DISPOSITION OF SPECIMEN:  PATHOLOGY  COUNTS:  procedure still in progress with Dr. Leta Baptist  DICTATION: .Dragon Dictation  PLAN OF CARE: Admit for overnight observation  PATIENT DISPOSITION:   remains in OR  FINDINGS:  no gross evidence of malignancy, implants intact  EBL: 50 mL  PROCEDURE:   Patient was identified in the holding area and then taken to the operating room where she was placed supine on operating room table.  Sequential compression devices were placed.  General anesthesia was induced.  A Foley catheter and active warming was placed.  [name] bilateral chest and breast were prepped and draped in sterile fashion.  A timeout was performed according to the surgical safety checklist.  When all was correct, we continued.  The patient had been marked preoperatively for incision by Dr. Leta Baptist.  The incision incorporated her nipples.  The right side was addressed first.  The triangular incision was opened with a #10 blade.  Mastectomy hooks were used to elevate the skin and the cautery was used to create the dissection flaps.  The lighted retractors were also used to assist with visualization.  The mastectomy flaps were taken superiorly to the clavicle, medially to the lateral sternal border, laterally to the latissimus, and inferiorly to the inframammary fold.  The previous implant capsule was opened.  This implant was in  the retropectoral location.  The implant was removed and was noted to be completely intact.  This was passed over to Dr. Leta Baptist for examination.  The cavity was then irrigated and inspected for hemostasis.  The skin flaps are also examined for evidence of bleeding and the evidence of significant residual breast tissue.  The left side was then addressed similarly.  The left implant was also removed intact.  This cavity was additionally inspected for hemostasis.  The patient was turned over to Dr. Leta Baptist for completion of the procedure.  The patient tolerated this portion of the operation well.

## 2020-08-27 NOTE — Interval H&P Note (Signed)
History and Physical Interval Note:  08/27/2020 7:25 AM  Caroline Friedman  has presented today for surgery, with the diagnosis of HIGH RISK BREAST CANCER.  The various methods of treatment have been discussed with the patient and family. After consideration of risks, benefits and other options for treatment, the patient has consented to  Procedure(s): BILATERAL TOTAL MASTECTOMY (Bilateral) BREAST RECONSTRUCTION WITH PLACEMENT OF TISSUE EXPANDER AND ALLODERM (Bilateral) as a surgical intervention.  [name] history has been reviewed, patient examined, no change in status, stable for surgery.  I have reviewed the patient's chart and labs.  Questions were answered to the patient's satisfaction.     [name]

## 2020-08-27 NOTE — Progress Notes (Signed)
Patient arrived to room 6N23 from PACU. Patient is alert and oriented times 4. Bed is in the lowest and locked position with bed rails up times 3. Belongings and call bell within reach.

## 2020-08-27 NOTE — Anesthesia Procedure Notes (Addendum)
Procedure Name: Intubation Date/Time: 08/27/2020 7:50 AM Performed by: Renford Dills, CRNA Pre-anesthesia Checklist: Patient identified, Emergency Drugs available, Suction available and Patient being monitored Patient Re-evaluated:Patient Re-evaluated prior to induction Oxygen Delivery Method: Circle System Utilized Preoxygenation: Pre-oxygenation with 100% oxygen Induction Type: IV induction Ventilation: Mask ventilation without difficulty Laryngoscope Size: Miller and 2 Grade View: Grade I Tube type: Oral Tube size: 7.0 mm Number of attempts: 1 Airway Equipment and Method: Stylet and Oral airway Placement Confirmation: ETT inserted through vocal cords under direct vision, positive ETCO2 and breath sounds checked- equal and bilateral Secured at: 20 cm Tube secured with: Tape Dental Injury: Teeth and Oropharynx as per pre-operative assessment

## 2020-08-27 NOTE — Transfer of Care (Signed)
Immediate Anesthesia Transfer of Care Note  Patient: Caroline Friedman  Procedure(s) Performed: BILATERAL TOTAL MASTECTOMY (Bilateral: Breast) BREAST RECONSTRUCTION WITH PLACEMENT OF TISSUE EXPANDER AND ALLODERM (Bilateral)  Patient Location: PACU  Anesthesia Type:General  Level of Consciousness: sedated  Airway & Oxygen Therapy: Patient Spontanous Breathing and Patient connected to nasal cannula oxygen  Post-op Assessment: Report given to RN and Post -op Vital signs reviewed and stable  Post vital signs: Reviewed and stable  Last Vitals:  Vitals Value Taken Time  BP 136/95 08/27/20 1208  Temp    Pulse 110 08/27/20 1208  Resp 14 08/27/20 1208  SpO2 99 % 08/27/20 1208  Vitals shown include unvalidated device data.  Last Pain:  Vitals:   08/27/20 0607  TempSrc:   PainSc: 0-No pain         Complications: No notable events documented.

## 2020-08-27 NOTE — Anesthesia Preprocedure Evaluation (Signed)
Anesthesia Evaluation  Patient identified by MRN, date of birth, ID band Patient awake    Reviewed: Allergy & Precautions, NPO status , Patient's Chart, lab work & pertinent test results  History of Anesthesia Complications Negative for: history of anesthetic complications  Airway Mallampati: I  TM Distance: >3 FB Neck ROM: Full    Dental  (+) Dental Advisory Given, Teeth Intact   Pulmonary neg shortness of breath, neg sleep apnea, neg COPD, neg recent URI, former smoker,  Covid-19 Nucleic Acid Test Results Lab Results      Component                Value               Date                      SARSCOV2NAA              NEGATIVE            08/24/2020              breath sounds clear to auscultation       Cardiovascular hypertension,  Rhythm:Regular     Neuro/Psych PSYCHIATRIC DISORDERS Anxiety negative neurological ROS     GI/Hepatic negative GI ROS, Neg liver ROS,   Endo/Other    Renal/GU negative Renal ROS     Musculoskeletal   Abdominal   Peds  Hematology negative hematology ROS (+) Lab Results      Component                Value               Date                      WBC                      8.5                 08/20/2020                HGB                      13.1                08/20/2020                HCT                      40.0                08/20/2020                MCV                      95.0                08/20/2020                PLT                      360                 08/20/2020              Anesthesia Other Findings   Reproductive/Obstetrics Lab Results      Component  Value               Date                      PREGTESTUR               NEGATIVE            08/27/2020                                        Anesthesia Physical Anesthesia Plan  ASA: 2  Anesthesia Plan: General and Regional   Post-op Pain Management:    Induction:  Intravenous  PONV Risk Score and Plan: 3 and Ondansetron, Dexamethasone and Propofol infusion  Airway Management Planned: Oral ETT  Additional Equipment: None  Intra-op Plan:   Post-operative Plan: Extubation in OR  Informed Consent: I have reviewed the patients History and Physical, chart, labs and discussed the procedure including the risks, benefits and alternatives for the proposed anesthesia with the patient or authorized representative who has indicated his/her understanding and acceptance.     Dental advisory given  Plan Discussed with: CRNA and Surgeon  Anesthesia Plan Comments:         Anesthesia Quick Evaluation

## 2020-08-27 NOTE — Interval H&P Note (Signed)
History and Physical Interval Note:  08/27/2020 6:52 AM  [name]  has presented today for surgery, with the diagnosis of HIGH RISK BREAST CANCER.  The various methods of treatment have been discussed with the patient and family. After consideration of risks, benefits and other options for treatment, the patient has consented to  Procedure(s): BILATERAL TOTAL MASTECTOMY (Bilateral) BREAST RECONSTRUCTION WITH PLACEMENT OF TISSUE EXPANDER AND ALLODERM (Bilateral) as a surgical intervention.  [name] history has been reviewed, patient examined, no change in status, stable for surgery.  I have reviewed the patient's chart and labs.  Questions were answered to the patient's satisfaction.     Caroline Friedman

## 2020-08-28 ENCOUNTER — Encounter (HOSPITAL_COMMUNITY): Payer: Self-pay | Admitting: General Surgery

## 2020-08-28 DIAGNOSIS — Z1501 Genetic susceptibility to malignant neoplasm of breast: Secondary | ICD-10-CM | POA: Diagnosis not present

## 2020-08-28 MED ORDER — SENNOSIDES-DOCUSATE SODIUM 8.6-50 MG PO TABS
1.0000 | ORAL_TABLET | Freq: Once | ORAL | Status: AC
  Administered 2020-08-28: 1 via ORAL
  Filled 2020-08-28: qty 1

## 2020-08-28 NOTE — Anesthesia Postprocedure Evaluation (Signed)
Anesthesia Post Note  Patient: KAMIYA ACORD  Procedure(s) Performed: BILATERAL TOTAL MASTECTOMY (Bilateral: Breast) BREAST RECONSTRUCTION WITH PLACEMENT OF TISSUE EXPANDER AND ALLODERM (Bilateral)     Patient location during evaluation: PACU Anesthesia Type: Regional and General Level of consciousness: awake and alert Pain management: pain level controlled Vital Signs Assessment: post-procedure vital signs reviewed and stable Respiratory status: spontaneous breathing, nonlabored ventilation, respiratory function stable and patient connected to nasal cannula oxygen Cardiovascular status: blood pressure returned to baseline and stable Postop Assessment: no apparent nausea or vomiting Anesthetic complications: no   No notable events documented.  Last Vitals:  Vitals:   08/28/20 0120 08/28/20 0506  BP: 119/84 118/76  Pulse: 75 77  Resp: 18 17  Temp: 36.5 C 36.8 C  SpO2: 99% 98%    Last Pain:  Vitals:   08/28/20 0639  TempSrc:   PainSc: 3                  Gracin Soohoo

## 2020-08-28 NOTE — Plan of Care (Signed)
  Problem: Education: Goal: Knowledge of disease or condition will improve Outcome: Adequate for Discharge   Problem: Activity: Goal: Ability to maintain or regain function will improve Outcome: Adequate for Discharge   Problem: Clinical Measurements: Goal: Postoperative complications will be avoided or minimized Outcome: Adequate for Discharge   Problem: Self-Concept: Goal: Ability to verbalize positive feelings about self will improve Outcome: Adequate for Discharge   Problem: Pain Management: Goal: Expressions of feelings of enhanced comfort will increase Outcome: Adequate for Discharge   Problem: Skin Integrity: Goal: Demonstration of wound healing without infection will improve Outcome: Adequate for Discharge   Problem: Education: Goal: Knowledge of General Education information will improve Description: Including pain rating scale, medication(s)/side effects and non-pharmacologic comfort measures Outcome: Adequate for Discharge   Problem: Health Behavior/Discharge Planning: Goal: Ability to manage health-related needs will improve Outcome: Adequate for Discharge   Problem: Clinical Measurements: Goal: Ability to maintain clinical measurements within normal limits will improve Outcome: Adequate for Discharge Goal: Will remain free from infection Outcome: Adequate for Discharge Goal: Diagnostic test results will improve Outcome: Adequate for Discharge Goal: Respiratory complications will improve Outcome: Adequate for Discharge Goal: Cardiovascular complication will be avoided Outcome: Adequate for Discharge   Problem: Activity: Goal: Risk for activity intolerance will decrease Outcome: Adequate for Discharge   Problem: Nutrition: Goal: Adequate nutrition will be maintained Outcome: Adequate for Discharge   Problem: Coping: Goal: Level of anxiety will decrease Outcome: Adequate for Discharge   Problem: Elimination: Goal: Will not experience complications  related to bowel motility Outcome: Adequate for Discharge Goal: Will not experience complications related to urinary retention Outcome: Adequate for Discharge   Problem: Pain Managment: Goal: General experience of comfort will improve Outcome: Adequate for Discharge   Problem: Safety: Goal: Ability to remain free from injury will improve Outcome: Adequate for Discharge   Problem: Skin Integrity: Goal: Risk for impaired skin integrity will decrease Outcome: Adequate for Discharge   

## 2020-08-28 NOTE — Discharge Summary (Signed)
Physician Discharge Summary  Patient ID: Caroline Friedman MRN: [id number] DOB/AGE: 46-Mar-1977 46 y.o.  Admit date: 08/27/2020 Discharge date: 08/28/2020  Admission Diagnoses:  High risk breast cancer  Discharge Diagnoses:  Active Problems:   At high risk for breast cancer   Discharged Condition: stable  Hospital Course: Post operatively patient tolerated diet, ambulatory with minimal assist and pain controlled with oral medication. Instructed on bathing and drain care.  Treatments: surgery: bilateral skin sparing mastectomies with tissue expander acellular dermis reconstruction 6.9.22  Discharge Exam: Blood pressure 118/76, pulse 77, temperature 98.3 F (36.8 C), temperature source Oral, resp. rate 17, height 5\' 2"  (1.575 m), weight 56.2 kg, last menstrual period 08/20/2020, SpO2 98 %, not currently breastfeeding. Incision/Wound: chest soft bilateral, bilateral ecchymoses without hematoma, incisions intact, drains serosanguinous  Disposition: Discharge disposition: 01-Home or Self Care       Discharge Instructions     Call MD for:  redness, tenderness, or signs of infection (pain, swelling, bleeding, redness, odor or green/yellow discharge around incision site)   Complete by: As directed    Call MD for:  temperature >100.5   Complete by: As directed    Discharge instructions   Complete by: As directed    Ok to remove dressings and shower am 6.11.22. Soap and water ok, pat Tegaderms dry. Do not remove Tegaderms. No creams or ointments over incisions. Do not let drains dangle in shower, attach to lanyard or similar.Strip and record drains twice daily and bring log to clinic visit.  Breast binder or soft compression bra all other times.  Ok to raise arms above shoulders for bathing and dressing.  No house yard work or exercise until cleared by MD.   Patient received all Rx preop. Recommend ibuprofen with meals to aid with pain control. Also ok to use Tylenol for pain. Recommend  Miralax or Dulcolax as needed for constipation.   Driving Restrictions   Complete by: As directed    No driving for 2 weeks then no driving if taking prescription pain medication   Lifting restrictions   Complete by: As directed    No lifting > 5 lbs until cleared by MD   Resume previous diet   Complete by: As directed       Allergies as of 08/28/2020   No Known Allergies      Medication List     STOP taking these medications    aspirin 81 MG chewable tablet       TAKE these medications    ALPRAZolam 0.25 MG tablet Commonly known as: XANAX Take 0.25 mg by mouth 2 (two) times daily as needed for anxiety.   losartan 25 MG tablet Commonly known as: COZAAR Take 25 mg by mouth daily.   Lumify 0.025 % Soln Generic drug: Brimonidine Tartrate Place 1 drop into both eyes daily.   valACYclovir 500 MG tablet Commonly known as: VALTREX Take 500-1,000 mg by mouth See admin instructions. Take 1000 mg by mouth on day one of outbreak, followed by 500 mg daily until gone   venlafaxine XR 150 MG 24 hr capsule Commonly known as: EFFEXOR-XR Take 150 mg by mouth daily.   Vitamin D (Ergocalciferol) 1.25 MG (50000 UNIT) Caps capsule Commonly known as: DRISDOL Take 50,000 Units by mouth every Sunday.       ASK your doctor about these medications    docusate sodium 100 MG capsule Commonly known as: COLACE Take 1 capsule (100 mg total) by mouth daily.   ibuprofen 600  MG tablet Commonly known as: ADVIL Take 1 tablet (600 mg total) by mouth every 6 (six) hours.        Follow-up Information     Glenna Fellows, MD Follow up in 1 week(s).   Specialty: Plastic Surgery Why: as scheduled Contact information: [address] [place] [address] [phone]         [name], MD. Schedule an appointment as soon as possible for a visit in 2 week(s).   Specialty: General Surgery Contact information: [address] [place]  [address] [phone]                 Signed: Glenna Fellows 08/28/2020, 7:10 AM

## 2020-08-28 NOTE — Progress Notes (Addendum)
Patient requesting to stay another night, states she does not feel ready to be back home yet, states she is still in pain and will not be able to manage her toddlers at home alone, husband is at work today and will not be able to assist her today.    Will be discharged today, husband will come after work to pick her up, 1500-1530.

## 2020-08-31 LAB — SURGICAL PATHOLOGY

## 2020-11-02 ENCOUNTER — Other Ambulatory Visit: Payer: Self-pay | Admitting: General Surgery

## 2020-11-02 DIAGNOSIS — R1031 Right lower quadrant pain: Secondary | ICD-10-CM

## 2020-11-02 NOTE — H&P (Signed)
Subjective:     Patient ID: Caroline Friedman is a 45 y.o. female.   HPI   2.5 months post op. Scheduled for implant exchange next month.   Patient's mother, MA, MGM have history breast ca. Genetic testing in mother negative; patient herself has not had testing. 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 Required heparin/Lovenox during pregnancies.    History mastopexy with Dr. Stephens November age 69. Per patient he did not want to do augmentation given familial cancer history. Underwent submuscular augmentation with saline implants with Dr. Rutherford Guys age 62, states has implant card at home. Prior to augmentation "very small." Happy with volume prior to mastectomies.    Pathology mastectomies benign. Removed intact smooth round saline implants with implant stamp "McGhan 360." Implant drained with approximately 400 ml saline present. Right mastectomy 225 g Left 230 g   Vaping with nicotine states last few weeks ago.   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 Husband is 7th grade teacher. Has two children.   Review of Systems      Objective:   Physical Exam  Cardiovascular: Normal rate, regular rhythm and normal heart sounds.  Pulmonary/Chest: Effort normal and breath sounds normal.      Chest:  Incisions intact few mm scab right vertical incision Bilateral chest expanded with rippling present   Abd soft umbilical hernia present    Assessment:     Family history breast cancer Hx submuscular saline augmentation mammaplasty S/p bilateral mastectomies, removal breast implants, prepectoral TE/ADM (Alloderm) reconstruction    Plan:     Encouraged to be nicotine free.   Plan removal bilateral chest tissue expanders and placement silicone implants, lipofilling from abdomen to bilateral chest.   Reviewed timing of next surgery for implant exchange. Reviewed saline vs  silicone, shaped v round. As in prepectoral position I recommend HCG or capacity filled silicone implants to reduce risk visible rippling. Reviewed MRI or [place] surveillance for rupture with silicone implants. Reviewed examples for 4th generation, capacity filled 4th generation, and HCG implants vs saline implants. Reviewed smooth vs textured and ALCL risk with latter, purpose of texturing. Patient has elected for silicone plan smooth round.    Discussed purpose fat grafting to thicken flaps, reduce visible rippling. Reviewed variable take graft, may need to repeat, fat necrosis that presents as masses, abdominal incisions, pain need for compression. Plan abdominal donor site, has Spanx type garment for post op use.   Reviewed OP surgery, no drains anticipated, and post op limitations for next surgery. Additional risks including but not limited bleeding seroma hematoma infection wound healing problems damage to adjacent structures, need for additional procedures, blood clots in legs or lungs reviewed.   Completed Eugenie Filler physician patient checklist.   Rx for tramadol robaxin and bactrim given.   Natrelle 133S FV-12 T 400 ml tissue expanders placed bilateral,  fill volume 360 ml saline bilateral

## 2020-11-13 ENCOUNTER — Other Ambulatory Visit: Payer: Self-pay

## 2020-11-13 ENCOUNTER — Encounter (HOSPITAL_BASED_OUTPATIENT_CLINIC_OR_DEPARTMENT_OTHER): Payer: Self-pay | Admitting: Plastic Surgery

## 2020-11-15 IMAGING — MR MR BREAST BILAT WO/W CM
8 of 12 series · 33 of 48 positions shown · IV contrast (gadavist)
Comparison: Previous exam(s).

CLINICAL DATA: 43-year-old female with a strong family history of
breast cancer including mother at age 69, maternal aunt at age 42
and maternal grandmother at age 55. Patient had augmentation
mammoplasty at age 26.

LABS:  None performed today and site.
EXAM:
BILATERAL BREAST MRI WITH AND WITHOUT CONTRAST
TECHNIQUE: Multiplanar, multisequence MR images of both breasts were obtained
prior to and following the intravenous administration of 5 ml of
Gadavist.

[Series 2: t2_tirm_tra ipat (a-p) · axial · 3.0mm · 0.70mm/px · 1 of 55 slices shown]
[im 1/55]
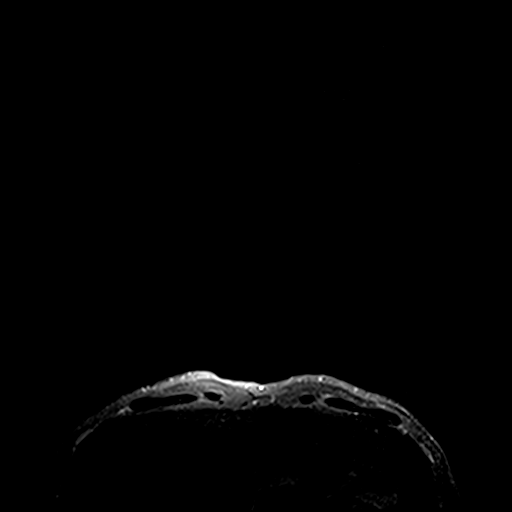

[Series 3: fl3d pre-cm no · axial · non-contrast · 1.2mm · 0.94mm/px · z∈[-60,+112]mm · 5 of 144 slices shown]
[im 1/144]
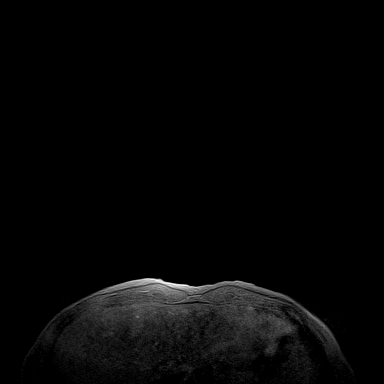
[im 36/144]
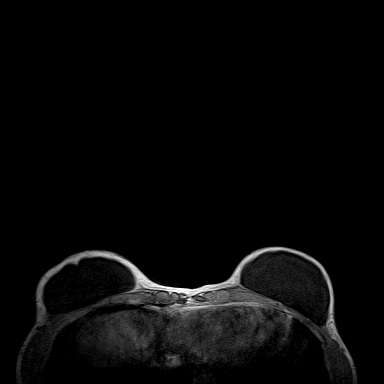
[im 72/144]
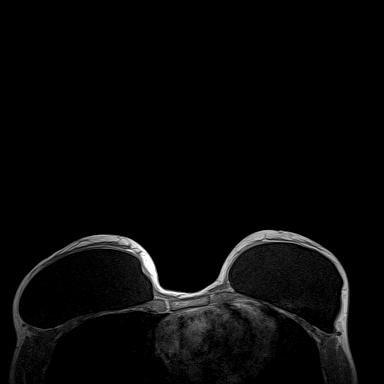
[im 108/144]
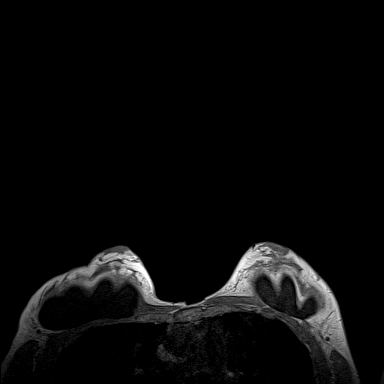
[im 144/144]
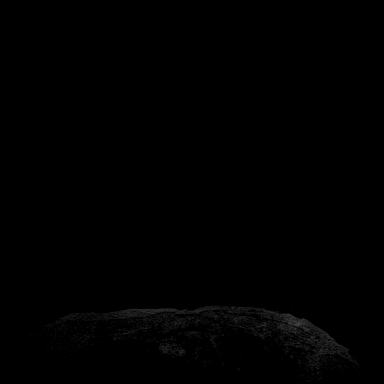

[Series 4: fl3d pre-cm · axial · non-contrast · 1.2mm · 0.94mm/px · z∈[-60,+112]mm · 5 of 144 slices shown]
[im 1/144]
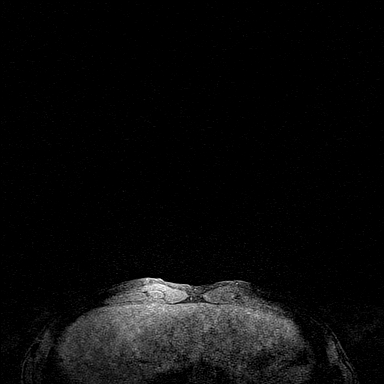
[im 36/144]
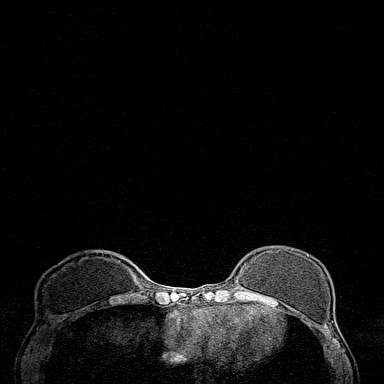
[im 72/144]
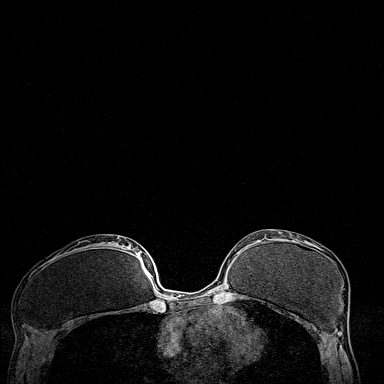
[im 108/144]
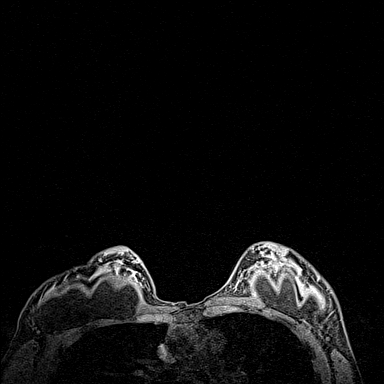
[im 144/144]
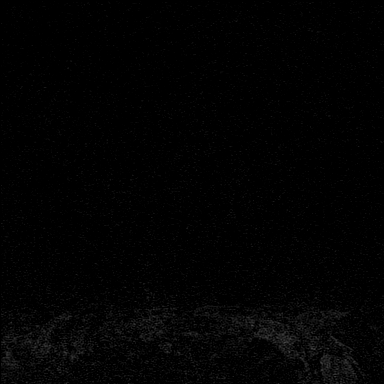

[Series 5: fl3d post-cm 20 · axial · 1.2mm · 0.94mm/px · z∈[-60,+112]mm · 5 of 144 slices shown (1 of 3)]
[im 1/144]
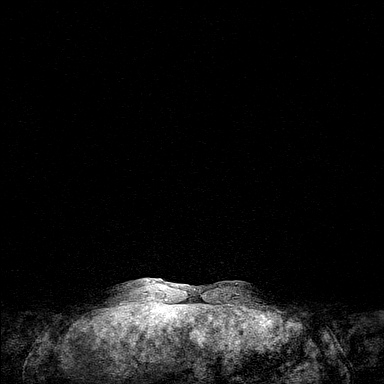
[im 36/144]
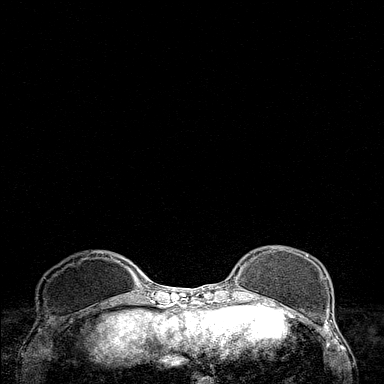
[im 72/144]
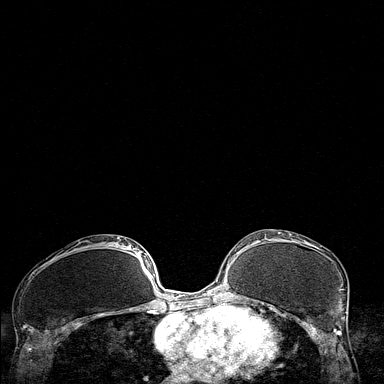
[im 108/144]
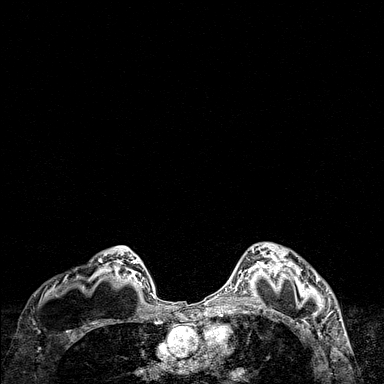
[im 144/144]
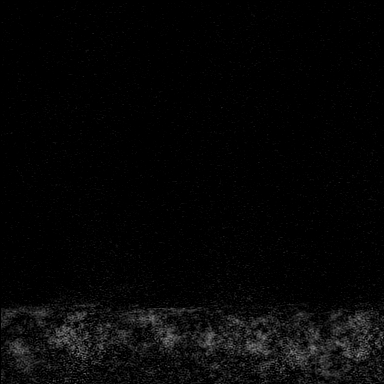

[Series 6: fl3d post-cm 20 · axial · 1.2mm · 0.94mm/px · z∈[-60,+112]mm · 5 of 144 slices shown (2 of 3)]
[im 1/144]
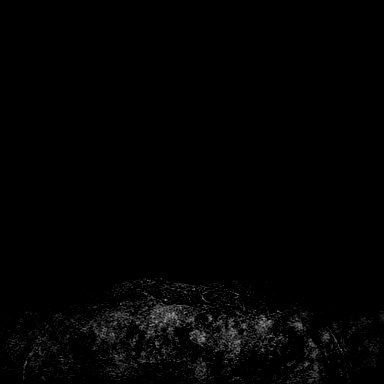
[im 36/144]
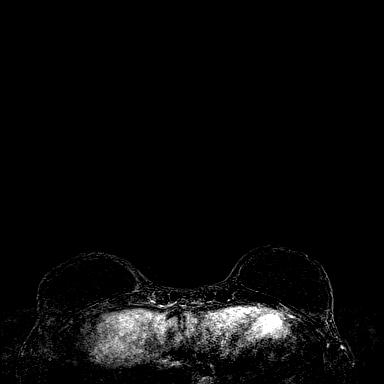
[im 72/144]
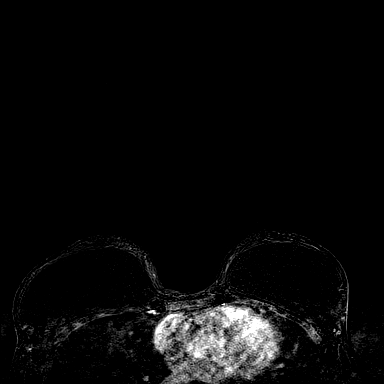
[im 108/144]
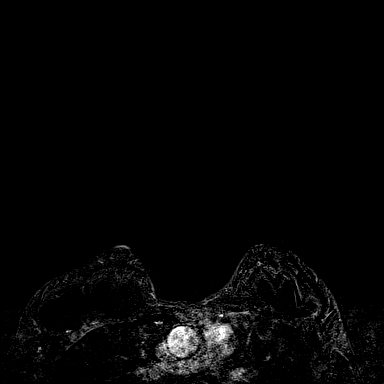
[im 144/144]
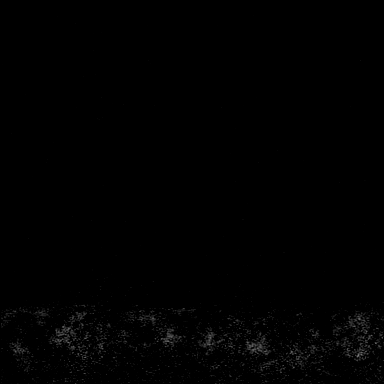

[Series 7: fl3d post-cm 20 · axial · 172.8mm · 0.94mm/px · 1 of 1 slices shown (3 of 3)]
[im 1/1]
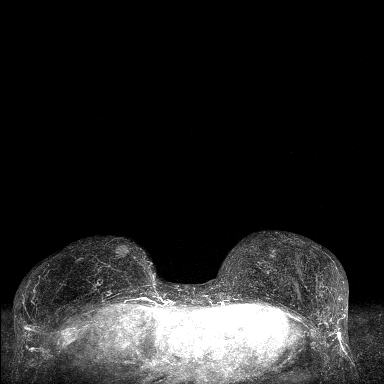

[Series 8: fl3d post-cm 3min · axial · 1.2mm · 0.94mm/px · z∈[-60,+112]mm · 6 of 144 slices shown]
[im 1/144]
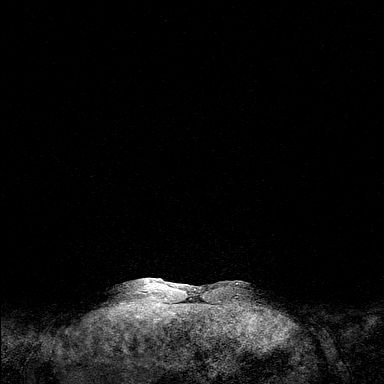
[im 29/144]
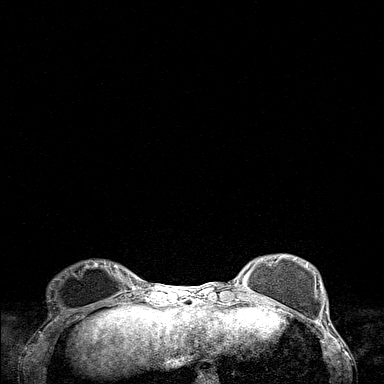
[im 58/144]
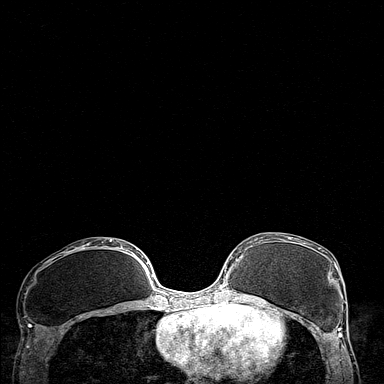
[im 86/144]
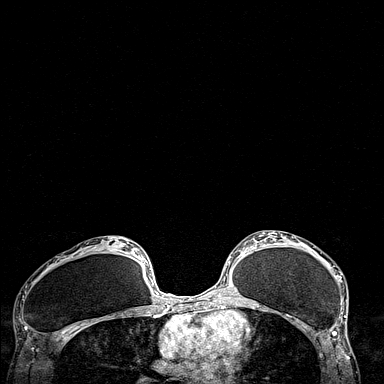
[im 115/144]
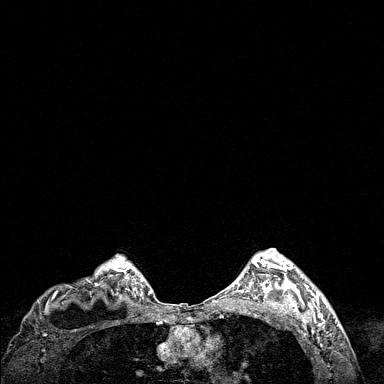
[im 144/144]
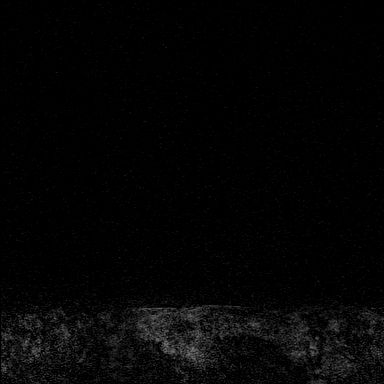

[Series 9: fl3d post-cm 3min_sub · axial · 1.2mm · 0.94mm/px · z∈[-60,+77]mm · 5 of 144 slices shown]
[im 1/144]
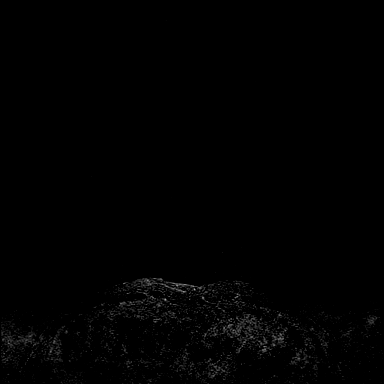
[im 29/144]
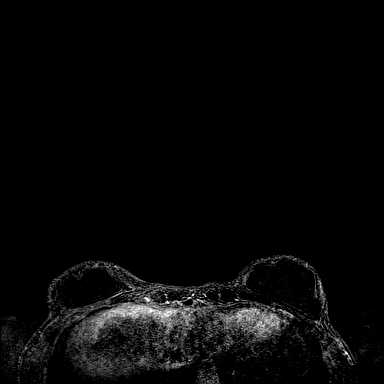
[im 58/144]
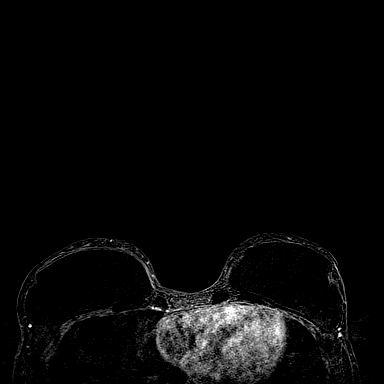
[im 86/144]
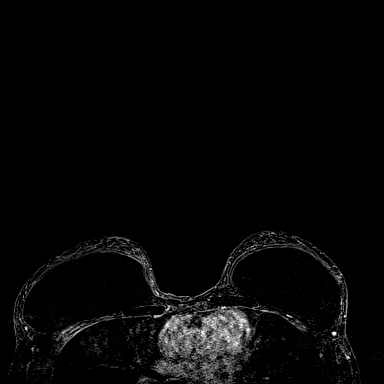
[im 115/144]
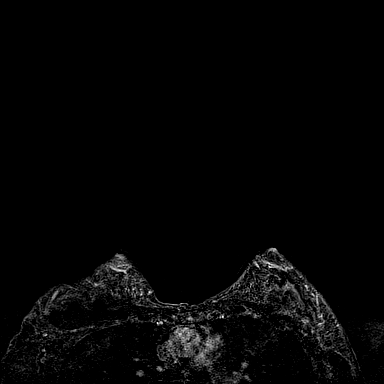

[33 of 48 positions shown; findings below may reference images not displayed]

Three-dimensional MR images were rendered by post-processing of the
original MR data on an independent workstation. The
three-dimensional MR images were interpreted, and findings are
reported in the following complete MRI report for this study. Three
dimensional images were evaluated at the independent [employer]
workstation
FINDINGS: Breast composition: c. Heterogeneous fibroglandular tissue.

Background parenchymal enhancement: Moderate.

Right breast: No mass or abnormal enhancement.

Left breast: No mass or abnormal enhancement.

Lymph nodes: No abnormal appearing lymph nodes.

Ancillary findings: Focally increased enhancement is seen within and
surrounding in anterior right rib (series 4, image 53/144).
IMPRESSION: 1. No MRI evidence of malignancy in either breast.
2. Focally increased enhancement within and surrounding an anterior
right rib (series 4, image 53/144). Differential includes possible
mass versus rib fracture with associated edema. Recommendation is
for contrast enhanced chest CT for further evaluation.

RECOMMENDATION:
1. Patient is due for annual bilateral screening mammogram in
April 2019.
2. Contrast enhanced chest CT for further evaluation of a focally
enhancing anterior right rib.

BI-RADS CATEGORY  1: Negative for breast cancer.

## 2020-11-20 ENCOUNTER — Encounter (HOSPITAL_BASED_OUTPATIENT_CLINIC_OR_DEPARTMENT_OTHER)
Admission: RE | Disposition: A | Discharge status: Home / Self Care | Payer: Self-pay | Source: Home / Self Care | Attending: Plastic Surgery

## 2020-11-20 ENCOUNTER — Ambulatory Visit (HOSPITAL_BASED_OUTPATIENT_CLINIC_OR_DEPARTMENT_OTHER)
Admission: RE | Admit: 2020-11-20 | Discharge: 2020-11-20 | Disposition: A | Discharge status: Home / Self Care | Payer: BC Managed Care – PPO | Attending: Plastic Surgery | Admitting: Plastic Surgery

## 2020-11-20 ENCOUNTER — Other Ambulatory Visit: Payer: Self-pay

## 2020-11-20 ENCOUNTER — Encounter (HOSPITAL_BASED_OUTPATIENT_CLINIC_OR_DEPARTMENT_OTHER): Payer: Self-pay | Admitting: Plastic Surgery

## 2020-11-20 ENCOUNTER — Ambulatory Visit (HOSPITAL_BASED_OUTPATIENT_CLINIC_OR_DEPARTMENT_OTHER): Payer: BC Managed Care – PPO | Admitting: Anesthesiology

## 2020-11-20 DIAGNOSIS — Z421 Encounter for breast reconstruction following mastectomy: Secondary | ICD-10-CM | POA: Diagnosis not present

## 2020-11-20 DIAGNOSIS — Z87891 Personal history of nicotine dependence: Secondary | ICD-10-CM | POA: Diagnosis not present

## 2020-11-20 DIAGNOSIS — Z9013 Acquired absence of bilateral breasts and nipples: Secondary | ICD-10-CM | POA: Insufficient documentation

## 2020-11-20 DIAGNOSIS — Z803 Family history of malignant neoplasm of breast: Secondary | ICD-10-CM | POA: Diagnosis not present

## 2020-11-20 HISTORY — DX: Essential (primary) hypertension: I10

## 2020-11-20 HISTORY — PX: REMOVAL OF BILATERAL TISSUE EXPANDERS WITH PLACEMENT OF BILATERAL BREAST IMPLANTS: SHX6431

## 2020-11-20 HISTORY — PX: LIPOSUCTION WITH LIPOFILLING: SHX6436

## 2020-11-20 LAB — POCT PREGNANCY, URINE
Preg Test, Ur: NEGATIVE
Preg Test, Ur: NEGATIVE

## 2020-11-20 SURGERY — REMOVAL, TISSUE EXPANDER, BREAST, BILATERAL, WITH BILATERAL IMPLANT IMPLANT INSERTION
Anesthesia: General | Site: Breast | Laterality: Bilateral

## 2020-11-20 MED ORDER — ACETAMINOPHEN 500 MG PO TABS: 1000.0000 mg | ORAL_TABLET | Freq: Once | ORAL | Status: DC | PRN

## 2020-11-20 MED ORDER — LIDOCAINE HCL (CARDIAC) PF 100 MG/5ML IV SOSY
PREFILLED_SYRINGE | INTRAVENOUS | Status: DC | PRN
  Administered 2020-11-20: 60 mg via INTRAVENOUS

## 2020-11-20 MED ORDER — ACETAMINOPHEN 500 MG PO TABS
ORAL_TABLET | ORAL | Status: AC
  Filled 2020-11-20: qty 2

## 2020-11-20 MED ORDER — ONDANSETRON HCL 4 MG/2ML IJ SOLN
INTRAMUSCULAR | Status: AC
  Filled 2020-11-20: qty 2

## 2020-11-20 MED ORDER — EPINEPHRINE PF 1 MG/ML IJ SOLN
INTRAMUSCULAR | Status: AC
  Filled 2020-11-20: qty 1

## 2020-11-20 MED ORDER — DEXAMETHASONE SODIUM PHOSPHATE 10 MG/ML IJ SOLN
INTRAMUSCULAR | Status: AC
  Filled 2020-11-20: qty 1

## 2020-11-20 MED ORDER — GABAPENTIN 300 MG PO CAPS
300.0000 mg | ORAL_CAPSULE | ORAL | Status: AC
  Administered 2020-11-20: 300 mg via ORAL

## 2020-11-20 MED ORDER — ACETAMINOPHEN 160 MG/5ML PO SOLN: 1000.0000 mg | Freq: Once | ORAL | Status: DC | PRN

## 2020-11-20 MED ORDER — ROCURONIUM BROMIDE 10 MG/ML (PF) SYRINGE
PREFILLED_SYRINGE | INTRAVENOUS | Status: AC
  Filled 2020-11-20: qty 10

## 2020-11-20 MED ORDER — CELECOXIB 200 MG PO CAPS
ORAL_CAPSULE | ORAL | Status: AC
  Filled 2020-11-20: qty 1

## 2020-11-20 MED ORDER — FENTANYL CITRATE (PF) 100 MCG/2ML IJ SOLN
INTRAMUSCULAR | Status: AC
  Filled 2020-11-20: qty 2

## 2020-11-20 MED ORDER — MIDAZOLAM HCL 2 MG/2ML IJ SOLN
INTRAMUSCULAR | Status: AC
  Filled 2020-11-20: qty 2

## 2020-11-20 MED ORDER — FENTANYL CITRATE (PF) 100 MCG/2ML IJ SOLN
25.0000 ug | INTRAMUSCULAR | Status: DC | PRN
  Administered 2020-11-20 (×2): 50 ug via INTRAVENOUS

## 2020-11-20 MED ORDER — PROPOFOL 10 MG/ML IV BOLUS
INTRAVENOUS | Status: DC | PRN
  Administered 2020-11-20: 20 mg via INTRAVENOUS
  Administered 2020-11-20: 160 mg via INTRAVENOUS

## 2020-11-20 MED ORDER — OXYCODONE HCL 5 MG PO TABS: 5.0000 mg | ORAL_TABLET | Freq: Once | ORAL | Status: DC | PRN

## 2020-11-20 MED ORDER — PROPOFOL 500 MG/50ML IV EMUL
INTRAVENOUS | Status: DC | PRN
  Administered 2020-11-20: 25 ug/kg/min via INTRAVENOUS

## 2020-11-20 MED ORDER — LIDOCAINE HCL (PF) 1 % IJ SOLN
INTRAMUSCULAR | Status: AC
  Filled 2020-11-20: qty 60

## 2020-11-20 MED ORDER — CHLORHEXIDINE GLUCONATE CLOTH 2 % EX PADS: 6.0000 | MEDICATED_PAD | Freq: Once | CUTANEOUS | Status: DC

## 2020-11-20 MED ORDER — SUGAMMADEX SODIUM 200 MG/2ML IV SOLN
INTRAVENOUS | Status: DC | PRN
  Administered 2020-11-20: 150 mg via INTRAVENOUS

## 2020-11-20 MED ORDER — ROCURONIUM BROMIDE 100 MG/10ML IV SOLN
INTRAVENOUS | Status: DC | PRN
  Administered 2020-11-20: 70 mg via INTRAVENOUS
  Administered 2020-11-20: 20 mg via INTRAVENOUS

## 2020-11-20 MED ORDER — CEFAZOLIN SODIUM-DEXTROSE 2-4 GM/100ML-% IV SOLN
2.0000 g | INTRAVENOUS | Status: AC
  Administered 2020-11-20: 2 g via INTRAVENOUS

## 2020-11-20 MED ORDER — ONDANSETRON HCL 4 MG/2ML IJ SOLN
INTRAMUSCULAR | Status: DC | PRN
  Administered 2020-11-20: 4 mg via INTRAVENOUS

## 2020-11-20 MED ORDER — CELECOXIB 200 MG PO CAPS
200.0000 mg | ORAL_CAPSULE | ORAL | Status: AC
Start: 2020-11-20 — End: 2020-11-20
  Administered 2020-11-20: 200 mg via ORAL

## 2020-11-20 MED ORDER — BUPIVACAINE HCL (PF) 0.5 % IJ SOLN
INTRAMUSCULAR | Status: AC
  Filled 2020-11-20: qty 30

## 2020-11-20 MED ORDER — OXYCODONE HCL 5 MG/5ML PO SOLN: 5.0000 mg | Freq: Once | ORAL | Status: DC | PRN

## 2020-11-20 MED ORDER — FENTANYL CITRATE (PF) 100 MCG/2ML IJ SOLN
INTRAMUSCULAR | Status: DC | PRN
  Administered 2020-11-20: 50 ug via INTRAVENOUS
  Administered 2020-11-20: 25 ug via INTRAVENOUS
  Administered 2020-11-20: 100 ug via INTRAVENOUS
  Administered 2020-11-20: 50 ug via INTRAVENOUS
  Administered 2020-11-20: 25 ug via INTRAVENOUS

## 2020-11-20 MED ORDER — EPHEDRINE SULFATE 50 MG/ML IJ SOLN
INTRAMUSCULAR | Status: DC | PRN
  Administered 2020-11-20 (×2): 5 mg via INTRAVENOUS

## 2020-11-20 MED ORDER — GABAPENTIN 300 MG PO CAPS
ORAL_CAPSULE | ORAL | Status: AC
  Filled 2020-11-20: qty 1

## 2020-11-20 MED ORDER — SODIUM BICARBONATE 4.2 % IV SOLN
INTRAVENOUS | Status: AC
  Filled 2020-11-20: qty 20

## 2020-11-20 MED ORDER — POVIDONE-IODINE 10 % EX SOLN
CUTANEOUS | Status: DC | PRN
  Administered 2020-11-20: 1 via TOPICAL

## 2020-11-20 MED ORDER — EPHEDRINE 5 MG/ML INJ
INTRAVENOUS | Status: AC
  Filled 2020-11-20: qty 10

## 2020-11-20 MED ORDER — LIDOCAINE HCL (PF) 2 % IJ SOLN
INTRAMUSCULAR | Status: AC
  Filled 2020-11-20: qty 5

## 2020-11-20 MED ORDER — ACETAMINOPHEN 500 MG PO TABS
1000.0000 mg | ORAL_TABLET | ORAL | Status: AC
  Administered 2020-11-20: 1000 mg via ORAL

## 2020-11-20 MED ORDER — MIDAZOLAM HCL 5 MG/5ML IJ SOLN
INTRAMUSCULAR | Status: DC | PRN
  Administered 2020-11-20: 2 mg via INTRAVENOUS

## 2020-11-20 MED ORDER — ACETAMINOPHEN 10 MG/ML IV SOLN: 1000.0000 mg | Freq: Once | INTRAVENOUS | Status: DC | PRN

## 2020-11-20 MED ORDER — DEXAMETHASONE SODIUM PHOSPHATE 4 MG/ML IJ SOLN
INTRAMUSCULAR | Status: DC | PRN
  Administered 2020-11-20: 10 mg via INTRAVENOUS

## 2020-11-20 MED ORDER — SODIUM BICARBONATE 4.2 % IV SOLN
INTRAVENOUS | Status: DC | PRN
  Administered 2020-11-20: 225 mL via INTRAMUSCULAR

## 2020-11-20 MED ORDER — PROPOFOL 10 MG/ML IV BOLUS
INTRAVENOUS | Status: AC
  Filled 2020-11-20: qty 20

## 2020-11-20 MED ORDER — SODIUM CHLORIDE 0.9 % IV SOLN
INTRAVENOUS | Status: DC | PRN
  Administered 2020-11-20: 600 mL

## 2020-11-20 MED ORDER — LACTATED RINGERS IV SOLN: INTRAVENOUS | Status: DC

## 2020-11-20 MED ORDER — SODIUM CHLORIDE 0.9 % IV SOLN
INTRAVENOUS | Status: AC
  Filled 2020-11-20: qty 10

## 2020-11-20 MED ORDER — CEFAZOLIN SODIUM-DEXTROSE 2-4 GM/100ML-% IV SOLN
INTRAVENOUS | Status: AC
  Filled 2020-11-20: qty 100

## 2020-11-20 SURGICAL SUPPLY — 81 items
BAG DECANTER FOR FLEXI CONT (MISCELLANEOUS) ×2 IMPLANT
BINDER ABDOMINAL  9 SM 30-45 (SOFTGOODS) ×2
BINDER ABDOMINAL 10 UNV 27-48 (MISCELLANEOUS) IMPLANT
BINDER ABDOMINAL 12 SM 30-45 (SOFTGOODS) IMPLANT
BINDER ABDOMINAL 9 SM 30-45 (SOFTGOODS) ×1 IMPLANT
BINDER BREAST 3XL (GAUZE/BANDAGES/DRESSINGS) IMPLANT
BINDER BREAST LRG (GAUZE/BANDAGES/DRESSINGS) ×2 IMPLANT
BINDER BREAST MEDIUM (GAUZE/BANDAGES/DRESSINGS) IMPLANT
BINDER BREAST XLRG (GAUZE/BANDAGES/DRESSINGS) IMPLANT
BINDER BREAST XXLRG (GAUZE/BANDAGES/DRESSINGS) IMPLANT
BLADE SURG 10 STRL SS (BLADE) ×2 IMPLANT
BLADE SURG 11 STRL SS (BLADE) ×2 IMPLANT
BNDG GAUZE ELAST 4 BULKY (GAUZE/BANDAGES/DRESSINGS) ×4 IMPLANT
CANISTER LIPO FAT HARVEST (MISCELLANEOUS) ×2 IMPLANT
CANISTER SUCT 1200ML W/VALVE (MISCELLANEOUS) ×2 IMPLANT
CHLORAPREP W/TINT 26 (MISCELLANEOUS) ×4 IMPLANT
COVER BACK TABLE 60X90IN (DRAPES) ×2 IMPLANT
COVER MAYO STAND STRL (DRAPES) ×4 IMPLANT
DECANTER SPIKE VIAL GLASS SM (MISCELLANEOUS) IMPLANT
DERMABOND ADVANCED (GAUZE/BANDAGES/DRESSINGS) ×2
DERMABOND ADVANCED .7 DNX12 (GAUZE/BANDAGES/DRESSINGS) ×2 IMPLANT
DRAIN CHANNEL 15F RND FF W/TCR (WOUND CARE) IMPLANT
DRAPE TOP ARMCOVERS (MISCELLANEOUS) ×2 IMPLANT
DRAPE U-SHAPE 76X120 STRL (DRAPES) ×2 IMPLANT
DRAPE UTILITY XL STRL (DRAPES) ×4 IMPLANT
DRSG PAD ABDOMINAL 8X10 ST (GAUZE/BANDAGES/DRESSINGS) ×8 IMPLANT
ELECT BLADE 4.0 EZ CLEAN MEGAD (MISCELLANEOUS)
ELECT COATED BLADE 2.86 ST (ELECTRODE) ×2 IMPLANT
ELECT REM PT RETURN 9FT ADLT (ELECTROSURGICAL) ×2
ELECTRODE BLDE 4.0 EZ CLN MEGD (MISCELLANEOUS) IMPLANT
ELECTRODE REM PT RTRN 9FT ADLT (ELECTROSURGICAL) ×1 IMPLANT
EVACUATOR SILICONE 100CC (DRAIN) IMPLANT
EXTRACTOR CANIST REVOLVE STRL (CANNISTER) IMPLANT
GLOVE SURG HYDRASOFT LTX SZ5.5 (GLOVE) ×4 IMPLANT
GOWN STRL REUS W/ TWL LRG LVL3 (GOWN DISPOSABLE) ×1 IMPLANT
GOWN STRL REUS W/TWL 2XL LVL3 (GOWN DISPOSABLE) ×2 IMPLANT
GOWN STRL REUS W/TWL LRG LVL3 (GOWN DISPOSABLE) ×2
IMPL BREAST P6.2XRND LO 525 (Breast) ×2 IMPLANT
IMPL BRST P6.2XRND LO 525CC (Breast) ×2 IMPLANT
IMPLANT BREAST GEL 525CC (Breast) ×4 IMPLANT
KIT FILL SYSTEM UNIVERSAL (SET/KITS/TRAYS/PACK) IMPLANT
LINER CANISTER 1000CC FLEX (MISCELLANEOUS) ×2 IMPLANT
MARKER SKIN DUAL TIP RULER LAB (MISCELLANEOUS) IMPLANT
NDL SAFETY ECLIPSE 18X1.5 (NEEDLE) ×2 IMPLANT
NEEDLE FILTER BLUNT 18X 1/2SAF (NEEDLE) ×1
NEEDLE FILTER BLUNT 18X1 1/2 (NEEDLE) ×1 IMPLANT
NEEDLE HYPO 18GX1.5 SHARP (NEEDLE) ×2
NEEDLE HYPO 25X1 1.5 SAFETY (NEEDLE) IMPLANT
NS IRRIG 1000ML POUR BTL (IV SOLUTION) ×2 IMPLANT
PACK BASIN DAY SURGERY FS (CUSTOM PROCEDURE TRAY) ×2 IMPLANT
PAD ALCOHOL SWAB (MISCELLANEOUS) ×8 IMPLANT
PENCIL SMOKE EVACUATOR (MISCELLANEOUS) ×2 IMPLANT
PIN SAFETY STERILE (MISCELLANEOUS) IMPLANT
SHEET MEDIUM DRAPE 40X70 STRL (DRAPES) ×4 IMPLANT
SIZER BREAST REUSE GEL 525CC (SIZER) ×2
SIZER BRST REUSE GEL 525CC (SIZER) ×1 IMPLANT
SLEEVE SCD COMPRESS KNEE MED (STOCKING) ×2 IMPLANT
SPONGE T-LAP 18X18 ~~LOC~~+RFID (SPONGE) ×4 IMPLANT
STAPLER VISISTAT 35W (STAPLE) ×2 IMPLANT
SUT ETHILON 2 0 FS 18 (SUTURE) IMPLANT
SUT MNCRL AB 4-0 PS2 18 (SUTURE) ×2 IMPLANT
SUT PDS AB 2-0 CT2 27 (SUTURE) IMPLANT
SUT VIC AB 3-0 PS1 18 (SUTURE)
SUT VIC AB 3-0 PS1 18XBRD (SUTURE) IMPLANT
SUT VIC AB 3-0 SH 27 (SUTURE) ×2
SUT VIC AB 3-0 SH 27X BRD (SUTURE) ×2 IMPLANT
SUT VICRYL 4-0 PS2 18IN ABS (SUTURE) ×2 IMPLANT
SYR 10ML LL (SYRINGE) ×6 IMPLANT
SYR 20ML LL LF (SYRINGE) ×2 IMPLANT
SYR 50ML LL SCALE MARK (SYRINGE) ×6 IMPLANT
SYR BULB IRRIG 60ML STRL (SYRINGE) ×2 IMPLANT
SYR CONTROL 10ML LL (SYRINGE) IMPLANT
SYR TB 1ML LL NO SAFETY (SYRINGE) ×2 IMPLANT
SYR TOOMEY IRRIG 70ML (MISCELLANEOUS)
SYRINGE TOOMEY IRRIG 70ML (MISCELLANEOUS) IMPLANT
TOWEL GREEN STERILE FF (TOWEL DISPOSABLE) ×4 IMPLANT
TUBE CONNECTING 20X1/4 (TUBING) ×2 IMPLANT
TUBING INFILTRATION IT-10001 (TUBING) ×2 IMPLANT
TUBING SET GRADUATE ASPIR 12FT (MISCELLANEOUS) ×2 IMPLANT
UNDERPAD 30X36 HEAVY ABSORB (UNDERPADS AND DIAPERS) ×4 IMPLANT
YANKAUER SUCT BULB TIP NO VENT (SUCTIONS) ×2 IMPLANT

## 2020-11-20 NOTE — Anesthesia Procedure Notes (Signed)
Procedure Name: Intubation Date/Time: 11/20/2020 7:40 AM Performed by: Thornell Mule, CRNA Pre-anesthesia Checklist: Patient identified, Emergency Drugs available, Suction available and Patient being monitored Patient Re-evaluated:Patient Re-evaluated prior to induction Oxygen Delivery Method: Circle system utilized Preoxygenation: Pre-oxygenation with 100% oxygen Induction Type: IV induction Ventilation: Mask ventilation without difficulty Laryngoscope Size: Miller and 3 Grade View: Grade I Tube type: Oral Tube size: 7.0 mm Number of attempts: 1 Airway Equipment and Method: Stylet and Oral airway Placement Confirmation: ETT inserted through vocal cords under direct vision, positive ETCO2 and breath sounds checked- equal and bilateral Secured at: 21 cm Tube secured with: Tape Dental Injury: Teeth and Oropharynx as per pre-operative assessment

## 2020-11-20 NOTE — Op Note (Signed)
Operative Note   DATE OF OPERATION: 9.2.22  LOCATION: [facility]-outpatient  SURGICAL DIVISION: Plastic Surgery  PREOPERATIVE DIAGNOSES:  1. Family history breast cancer 2. Acquired absence breasts  POSTOPERATIVE DIAGNOSES:  same  PROCEDURE:  1. Removal bilateral chest tissue expanders and placement silicone implants 2. Lipofilling to bilateral chest total 90 ml  SURGEON: Glenna Fellows MD MBA  ASSISTANT: none  ANESTHESIA:  General.   EBL: 25 ml  COMPLICATIONS: None immediate.   INDICATIONS FOR PROCEDURE:  The patient, Caroline Friedman, is a 45 y.o. female born on 21-Aug-1975, is here for staged breast reconstruction following bilateral skin reduction pattern mastectomies with prepectoral tissue expander acellular dermis reconstruction   FINDINGS: Complete incorporation ADM noted bilateral. 45 ml fat infiltrated throughout each mastectomy flap. Natrelle Soft Touch Extra Projection Smooth Round 525 ml implants placed bilateral. REF SSX- 525 RIGHT SN [id number] LEFT SN [id number]  DESCRIPTION OF PROCEDURE:  [name] operative site was marked including supra and infra umbilcal abdomen, bilateral flanks for liposuction. The patient was taken to the operating room. SCDs were placed and IV antibiotics were given. [name] operative site was prepped and draped in a sterile fashion. A time out was performed and all information was confirmed to be correct. I began on left side. Incision made through prior inframammary fold scar and carried through superficial fascia to acellular demis. ADM incised. Expander removed. Full incorporation ADM noted. Superior medial capsulotomy performed. Sizer placed.   I then directed attention to right chest. Incision made in prior inframammary fold scar and implant cavity entered in similar manner. Expander removed and well incorporated ADM noted. Superior medial capsulotomy performed. Sizer placed. Patient brought to upright sitting position. Natrelle  Smooth Round Extra Projection 525 ml implant selected for bilateral placement. Patient returned to supine position.   Stab incision made over bilateral abdomen. Tumescent fluid infiltrated over supra and infraumbilical abdomen, total 225 ml tumescent infiltrated. Power assisted liposuction performed to endpoint symmetric contour and soft tissue thickness, total lipoaspirate 175 ml. The fat was then washed and prepared by gravity for infiltration. Harvested fat was then infiltrated in subcutaneous plane throughout total envelope bilateral mastectomy flaps.    Each cavity irrigated with saline solution containing Betadine, Ancef, gentamicin solution. Hemostasis ensured.The implant was placed in right chest and implant orientation ensured. Closure completed with 3-0 vicryl to close superficial fascia and ADM over implant. 4-0 vicryl used to close dermis followed by 4-0 monocryl subcuticular. Implant placed in left chest cavity. Closure completed in similar fashion. Abdomen incisions approximated with simple 4-0 monocryl stitch. Dermabond applied to chest incisions. Dry dressing applied, followed by breast binder and abdominal binder.   The patient was allowed to wake from anesthesia, extubated and taken to the recovery room in satisfactory condition.   SPECIMENS: none  DRAINS: none

## 2020-11-20 NOTE — Interval H&P Note (Signed)
History and Physical Interval Note:  11/20/2020 6:52 AM  Caroline Friedman  has presented today for surgery, with the diagnosis of acquired absence breasts, high risk breast cancer.  The various methods of treatment have been discussed with the patient and family. After consideration of risks, benefits and other options for treatment, the patient has consented to  Procedure(s): REMOVAL OF BILATERAL TISSUE EXPANDERS WITH PLACEMENT OF BILATERAL SILICONE BREAST IMPLANTS (Bilateral) LIPOFILLING FROM ABDOMEN TO BILATERAL CHEST (Bilateral) as a surgical intervention.  [name] history has been reviewed, patient examined, no change in status, stable for surgery.  I have reviewed the patient's chart and labs.  Questions were answered to the patient's satisfaction.     Caroline Friedman

## 2020-11-20 NOTE — Transfer of Care (Signed)
Immediate Anesthesia Transfer of Care Note  Patient: Caroline Friedman  Procedure(s) Performed: REMOVAL OF BILATERAL TISSUE EXPANDERS WITH PLACEMENT OF BILATERAL SILICONE BREAST IMPLANTS (Bilateral: Breast) LIPOFILLING FROM ABDOMEN TO BILATERAL CHEST (Bilateral: Breast)  Patient Location: PACU  Anesthesia Type:General  Level of Consciousness: drowsy, patient cooperative and responds to stimulation  Airway & Oxygen Therapy: Patient Spontanous Breathing and Patient connected to face mask oxygen  Post-op Assessment: Report given to RN and Post -op Vital signs reviewed and stable  Post vital signs: Reviewed and stable  Last Vitals:  Vitals Value Taken Time  BP 145/99 11/20/20 0934  Temp    Pulse 105 11/20/20 0936  Resp 17 11/20/20 0936  SpO2 100 % 11/20/20 0936  Vitals shown include unvalidated device data.  Last Pain:  Vitals:   11/20/20 0704  TempSrc: Oral  PainSc: 0-No pain      Patients Stated Pain Goal: 4 (11/20/20 0704)  Complications: No notable events documented.

## 2020-11-20 NOTE — Anesthesia Preprocedure Evaluation (Addendum)
Anesthesia Evaluation  Patient identified by MRN, date of birth, ID band Patient awake    Reviewed: Allergy & Precautions, NPO status , Patient's Chart, lab work & pertinent test results  History of Anesthesia Complications Negative for: history of anesthetic complications  Airway Mallampati: I  TM Distance: >3 FB Neck ROM: Full    Dental  (+) Dental Advisory Given, Teeth Intact   Pulmonary neg shortness of breath, neg sleep apnea, neg COPD, neg recent URI, former smoker,    breath sounds clear to auscultation       Cardiovascular hypertension, Pt. on medications  Rhythm:Regular     Neuro/Psych PSYCHIATRIC DISORDERS Anxiety negative neurological ROS     GI/Hepatic negative GI ROS, Neg liver ROS,   Endo/Other  negative endocrine ROS  Renal/GU negative Renal ROS     Musculoskeletal negative musculoskeletal ROS (+)   Abdominal   Peds  Hematology negative hematology ROS (+) Lab Results      Component                Value               Date                      WBC                      8.5                 08/20/2020                HGB                      13.1                08/20/2020                HCT                      40.0                08/20/2020                MCV                      95.0                08/20/2020                PLT                      360                 08/20/2020              Anesthesia Other Findings   Reproductive/Obstetrics                            Anesthesia Physical Anesthesia Plan  ASA: 2  Anesthesia Plan: General   Post-op Pain Management:    Induction: Intravenous  PONV Risk Score and Plan: 3 and Ondansetron, Dexamethasone and Midazolam  Airway Management Planned: Oral ETT and LMA  Additional Equipment: None  Intra-op Plan:   Post-operative Plan: Extubation in OR  Informed Consent: I have reviewed the patients History and Physical,  chart, labs and discussed the procedure including the risks, benefits and  alternatives for the proposed anesthesia with the patient or authorized representative who has indicated his/her understanding and acceptance.     Dental advisory given  Plan Discussed with: CRNA and Anesthesiologist  Anesthesia Plan Comments:         Anesthesia Quick Evaluation

## 2020-11-20 NOTE — Discharge Instructions (Signed)

## 2020-11-23 NOTE — Anesthesia Postprocedure Evaluation (Signed)
Anesthesia Post Note  Patient: BRANAE CRAIL  Procedure(s) Performed: REMOVAL OF BILATERAL TISSUE EXPANDERS WITH PLACEMENT OF BILATERAL SILICONE BREAST IMPLANTS (Bilateral: Breast) LIPOFILLING FROM ABDOMEN TO BILATERAL CHEST (Bilateral: Breast)     Patient location during evaluation: PACU Anesthesia Type: General Level of consciousness: awake and alert Pain management: pain level controlled Vital Signs Assessment: post-procedure vital signs reviewed and stable Respiratory status: spontaneous breathing, nonlabored ventilation, respiratory function stable and patient connected to nasal cannula oxygen Cardiovascular status: blood pressure returned to baseline and stable Postop Assessment: no apparent nausea or vomiting Anesthetic complications: no   No notable events documented.  Last Vitals:  Vitals:   11/20/20 1015 11/20/20 1055  BP: (!) 133/92 (!) 145/95  Pulse: 88 98  Resp: 17 18  Temp:  37.1 C  SpO2: 92% 99%    Last Pain:  Vitals:   11/20/20 1055  TempSrc: Oral  PainSc: 3                  Shaniyah Wix

## 2020-11-24 ENCOUNTER — Encounter (HOSPITAL_BASED_OUTPATIENT_CLINIC_OR_DEPARTMENT_OTHER): Payer: Self-pay | Admitting: Plastic Surgery

## 2020-12-03 ENCOUNTER — Ambulatory Visit
Admission: RE | Admit: 2020-12-03 | Discharge: 2020-12-03 | Disposition: A | Discharge status: Home / Self Care | Payer: BC Managed Care – PPO | Source: Ambulatory Visit | Attending: General Surgery | Admitting: General Surgery

## 2020-12-03 DIAGNOSIS — R1031 Right lower quadrant pain: Secondary | ICD-10-CM

## 2020-12-03 MED ORDER — IOPAMIDOL (ISOVUE-370) INJECTION 76%
80.0000 mL | Freq: Once | INTRAVENOUS | Status: AC | PRN
  Administered 2020-12-03: 80 mL via INTRAVENOUS

## 2021-04-12 ENCOUNTER — Other Ambulatory Visit (HOSPITAL_COMMUNITY): Payer: Self-pay | Admitting: Orthopedic Surgery

## 2021-04-12 DIAGNOSIS — M79669 Pain in unspecified lower leg: Secondary | ICD-10-CM

## 2021-04-12 DIAGNOSIS — M7989 Other specified soft tissue disorders: Secondary | ICD-10-CM

## 2021-04-13 ENCOUNTER — Other Ambulatory Visit: Payer: Self-pay

## 2021-04-13 ENCOUNTER — Ambulatory Visit (HOSPITAL_COMMUNITY)
Admission: RE | Admit: 2021-04-13 | Discharge: 2021-04-13 | Disposition: A | Discharge status: Home / Self Care | Payer: BC Managed Care – PPO | Source: Ambulatory Visit | Attending: Orthopedic Surgery | Admitting: Orthopedic Surgery

## 2021-04-13 DIAGNOSIS — M79669 Pain in unspecified lower leg: Secondary | ICD-10-CM | POA: Diagnosis present

## 2021-04-13 DIAGNOSIS — M7989 Other specified soft tissue disorders: Secondary | ICD-10-CM

## 2023-11-06 ENCOUNTER — Ambulatory Visit: Payer: Self-pay
